# Patient Record
Sex: Male | Born: 1935 | Race: White | Hispanic: No | Marital: Married | Smoking: Former smoker
Health system: Southern US, Community
[De-identification: ages and names within clinical notes are randomized; demographics above are authoritative.]

## PROBLEM LIST (undated history)

## (undated) ENCOUNTER — Inpatient Hospital Stay (HOSPITAL_COMMUNITY): Payer: Medicare HMO | Admitting: Podiatry

## (undated) DIAGNOSIS — I10 Essential (primary) hypertension: Secondary | ICD-10-CM

## (undated) DIAGNOSIS — N189 Chronic kidney disease, unspecified: Secondary | ICD-10-CM

## (undated) DIAGNOSIS — I350 Nonrheumatic aortic (valve) stenosis: Secondary | ICD-10-CM

## (undated) DIAGNOSIS — J189 Pneumonia, unspecified organism: Secondary | ICD-10-CM

## (undated) DIAGNOSIS — Z87442 Personal history of urinary calculi: Secondary | ICD-10-CM

## (undated) DIAGNOSIS — E119 Type 2 diabetes mellitus without complications: Secondary | ICD-10-CM

## (undated) HISTORY — PX: HERNIA REPAIR: SHX51

## (undated) HISTORY — PX: APPENDECTOMY: SHX54

## (undated) HISTORY — PX: FINGER SURGERY: SHX640

## (undated) HISTORY — PX: EYE SURGERY: SHX253

## (undated) HISTORY — PX: CHOLECYSTECTOMY: SHX55

---

## 2007-01-07 ENCOUNTER — Inpatient Hospital Stay (HOSPITAL_COMMUNITY): Admission: RE | Admit: 2007-01-07 | Discharge: 2007-01-10 | Payer: Self-pay | Admitting: Specialist

## 2007-06-29 ENCOUNTER — Inpatient Hospital Stay (HOSPITAL_COMMUNITY): Admission: EM | Admit: 2007-06-29 | Discharge: 2007-07-02 | Payer: Self-pay | Admitting: Emergency Medicine

## 2007-07-01 ENCOUNTER — Encounter (INDEPENDENT_AMBULATORY_CARE_PROVIDER_SITE_OTHER): Payer: Self-pay | Admitting: Surgery

## 2007-07-02 ENCOUNTER — Ambulatory Visit: Payer: Self-pay | Admitting: Internal Medicine

## 2008-04-24 IMAGING — CR DG CHEST 2V
2 series · 2 of 2 positions shown · non-contrast
Comparison: none

CLINICAL DATA: 72-year-old with left knee osteoarthritis.  Preoperative evaluation.  History of smoking.
CHEST - 2 VIEW:

[w chest pa]
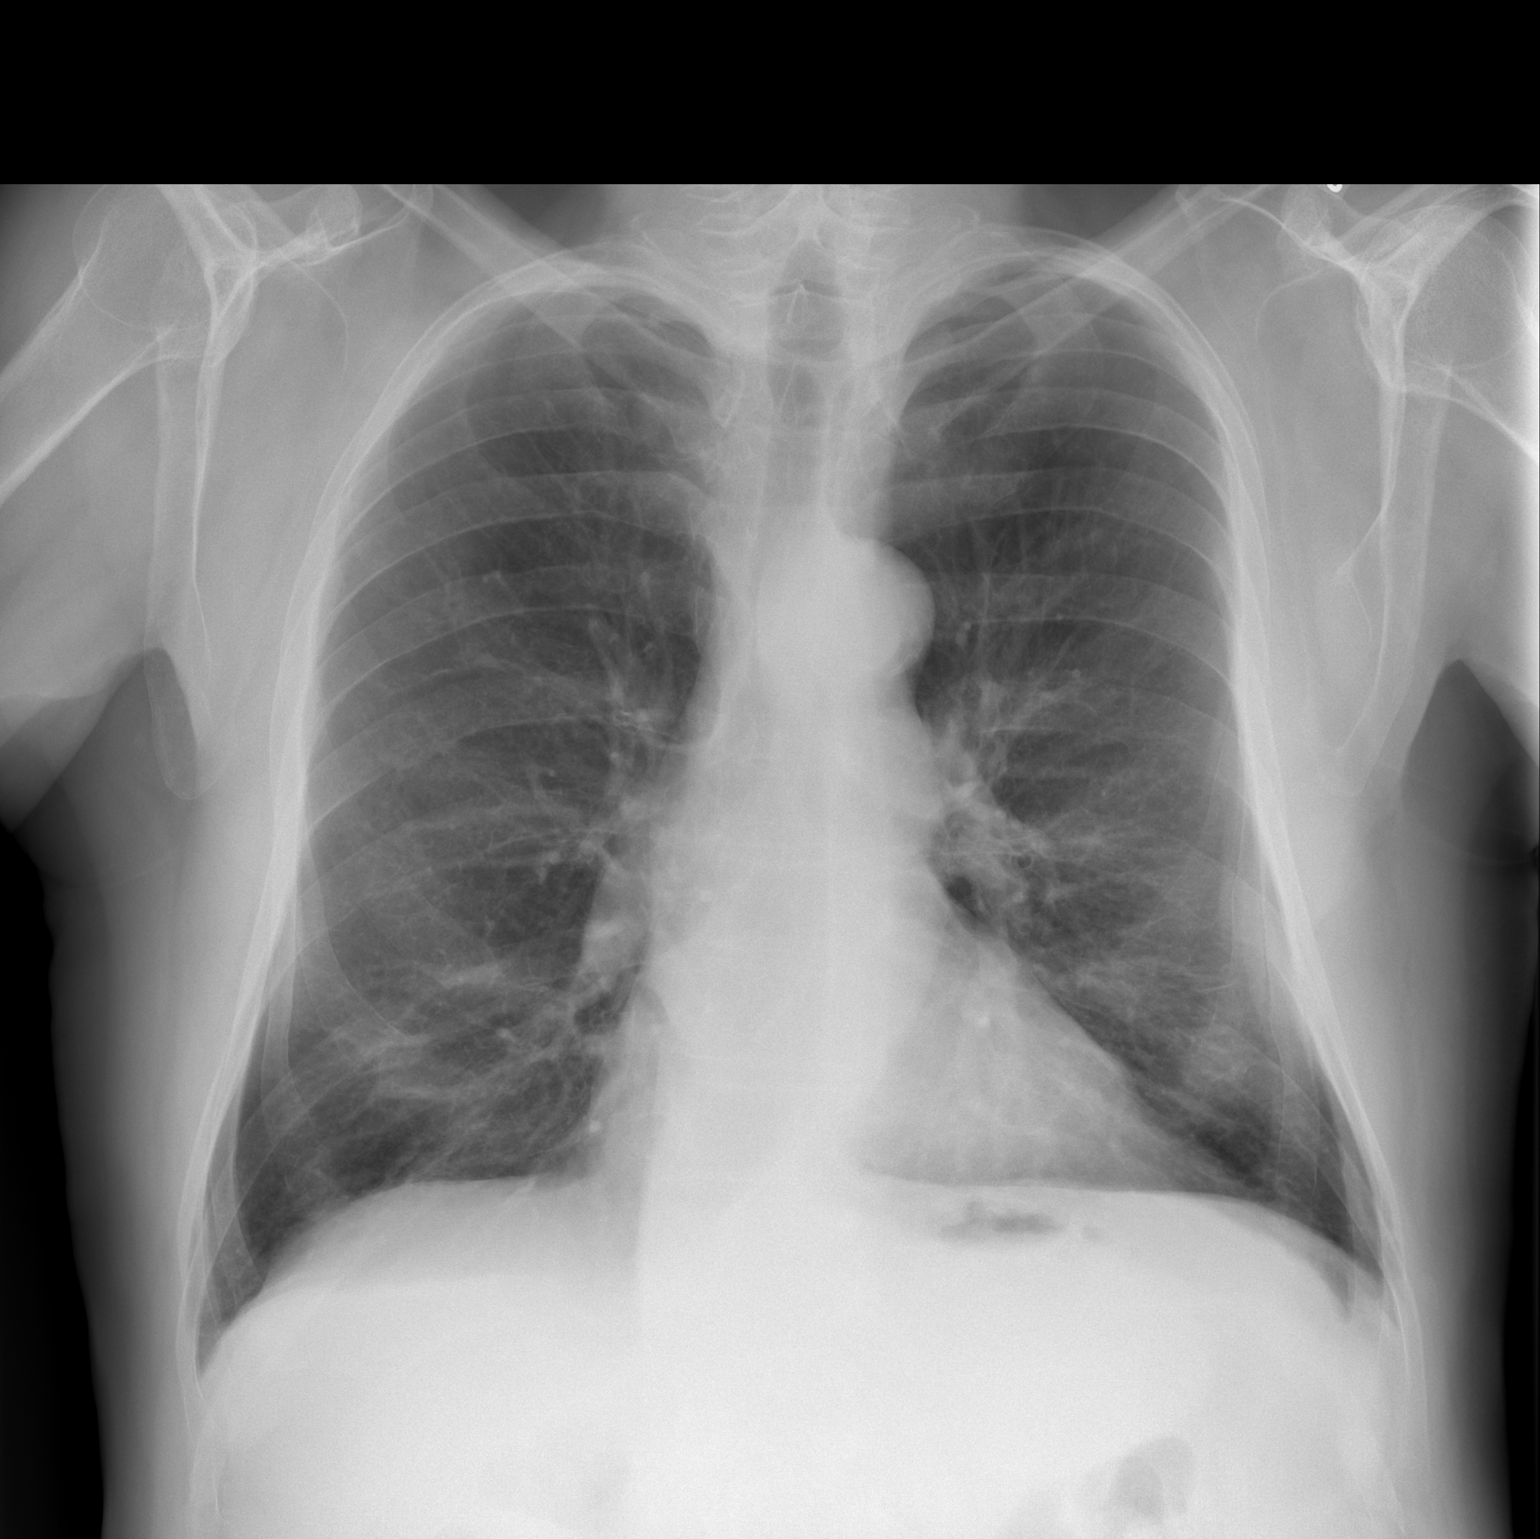

[w chest lat]
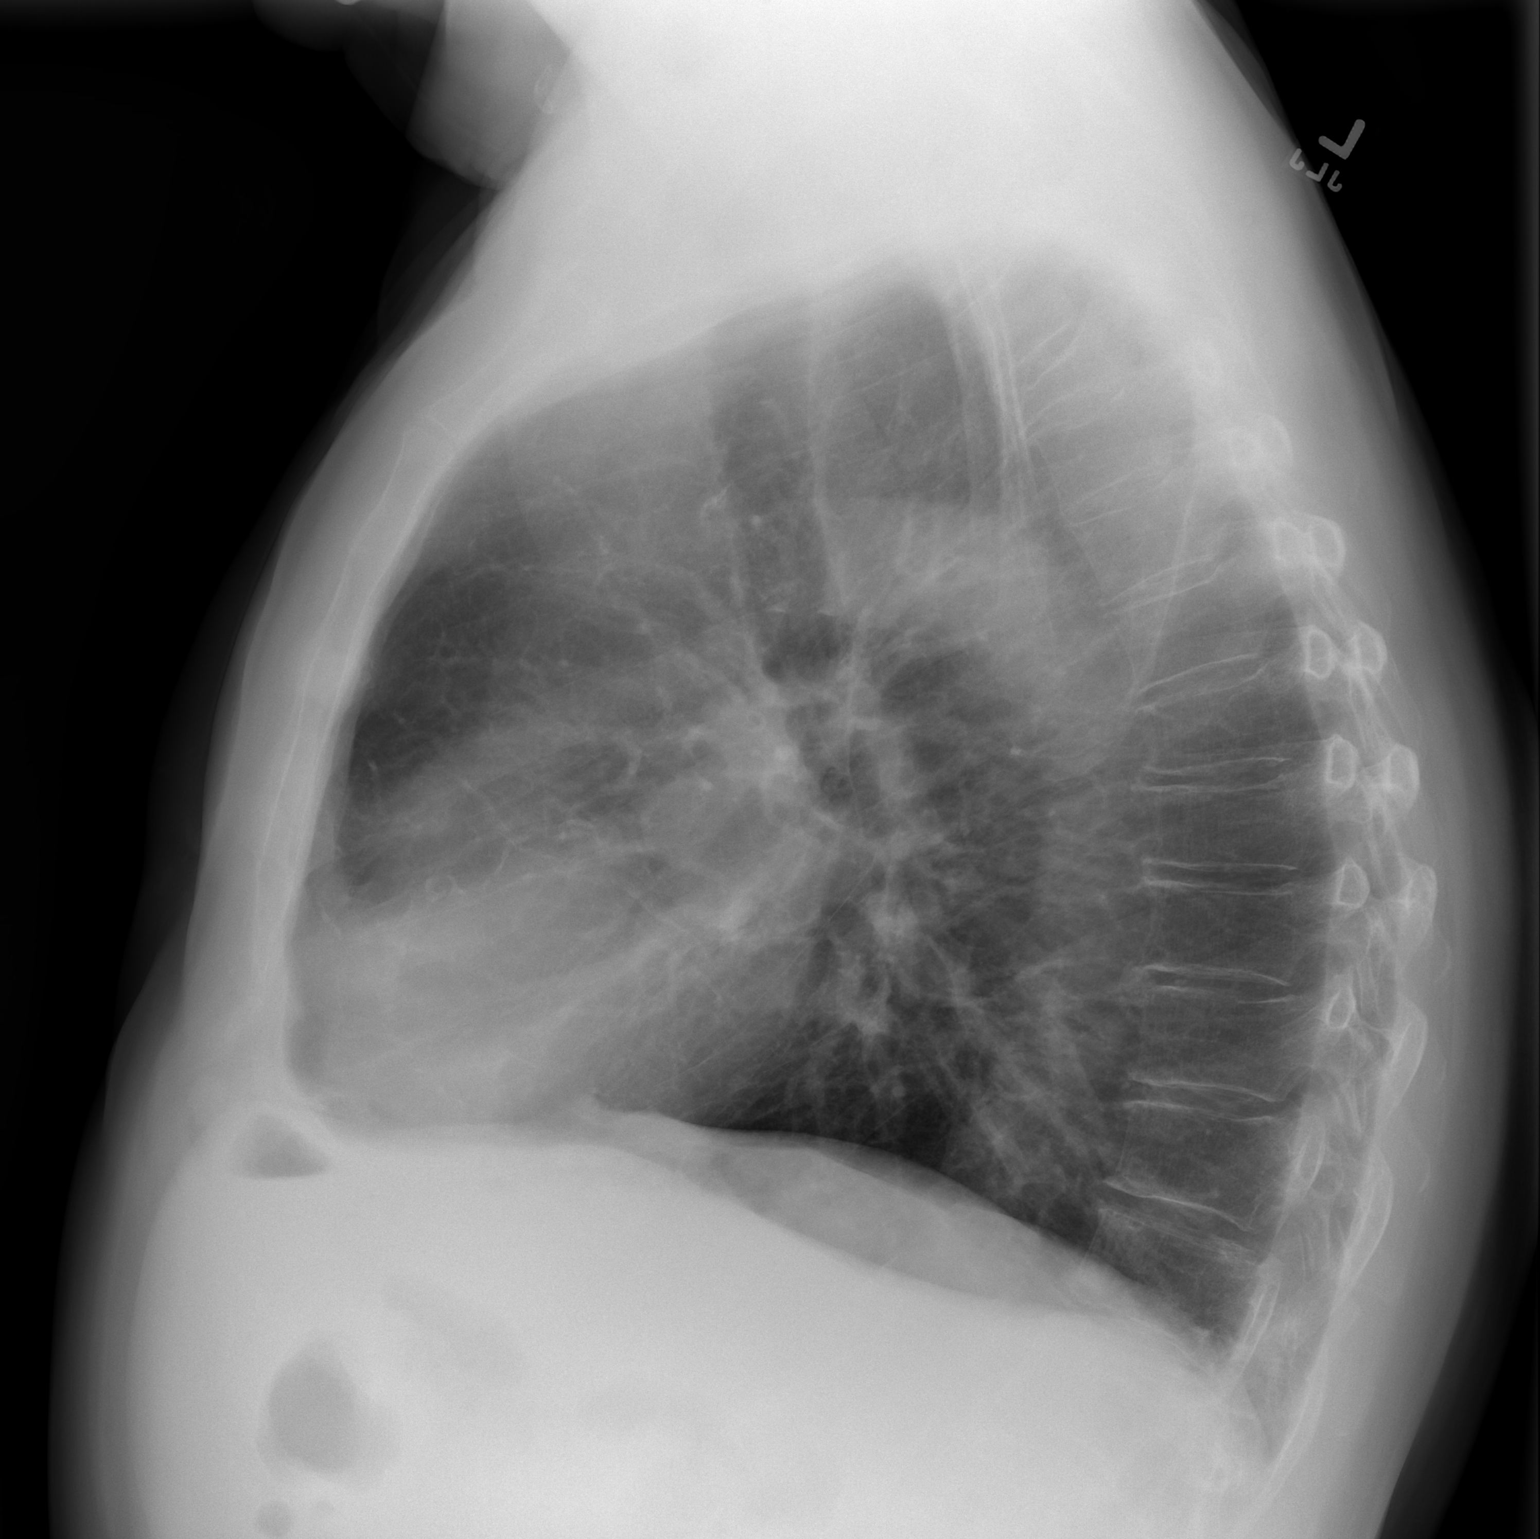

[2 of 2 positions shown; findings below may reference images not displayed]

FINDINGS: Two views of the chest demonstrate mild hyperinflation probably representing a component of emphysema.  Heart and mediastinum are within normal limits.  The trachea is midline.  Prominent vascular interstitial markings in the lower lung regions likely chronic in nature.  However, there is vague nodular density in the left lung base.  Unclear this is related to a nipple shadow.  It may also represent a conglomeration of overlying shadows and structures.  Otherwise, there is no focal air space disease.
IMPRESSION: 1.  Hyperinflation and probable emphysematous changes. 
2.  Indeterminate opacity in the left lung base.  Although this could represent nipple or even overlying shadows, recommend further evaluation with oblique chest images or CT to exclude a pulmonary nodule.

## 2008-10-20 IMAGING — US US ABDOMEN COMPLETE
1 series · 14 of 25 positions shown · non-contrast
Comparison: None

CLINICAL DATA: Abdominal pain

ABDOMEN ULTRASOUND
TECHNIQUE: Complete abdominal ultrasound examination was performed including
evaluation of the liver, gallbladder, bile ducts, pancreas, kidneys, spleen,
IVC, and abdominal aorta.

[Series 1: unknown · 0.43mm/px · 14 of 72 slices shown]
[im 1/72]
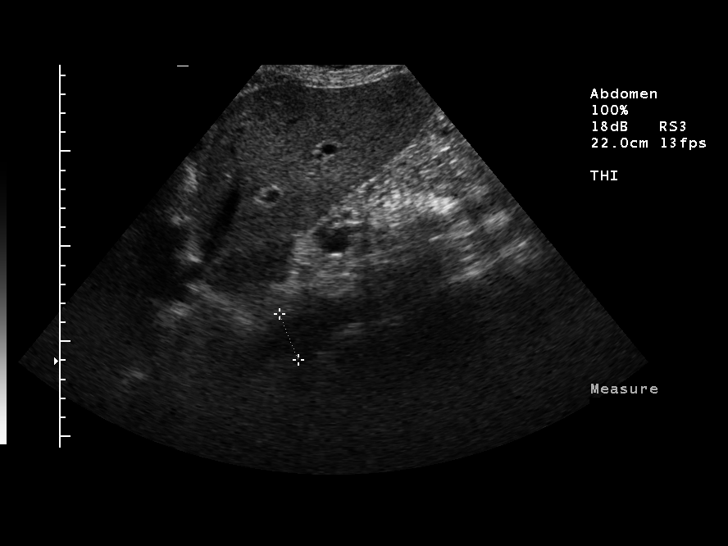
[im 6/72]
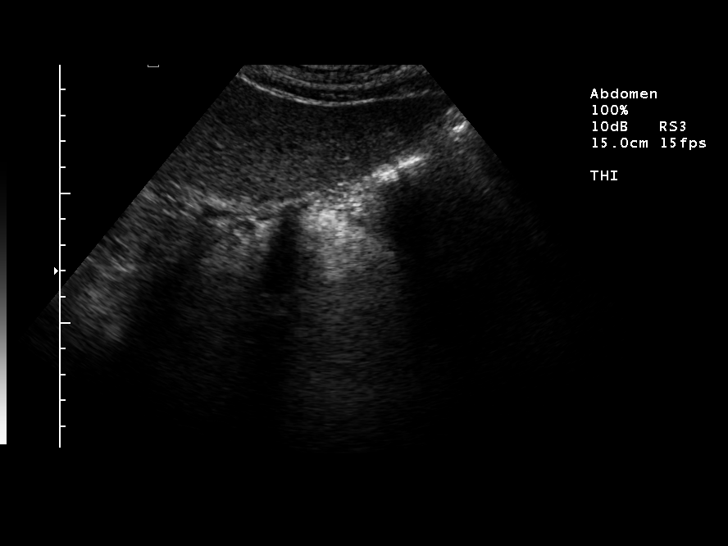
[im 12/72]
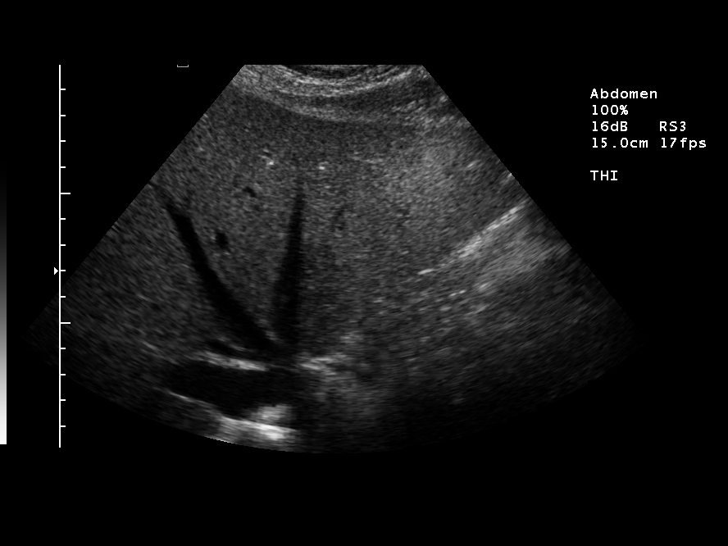
[im 18/72]
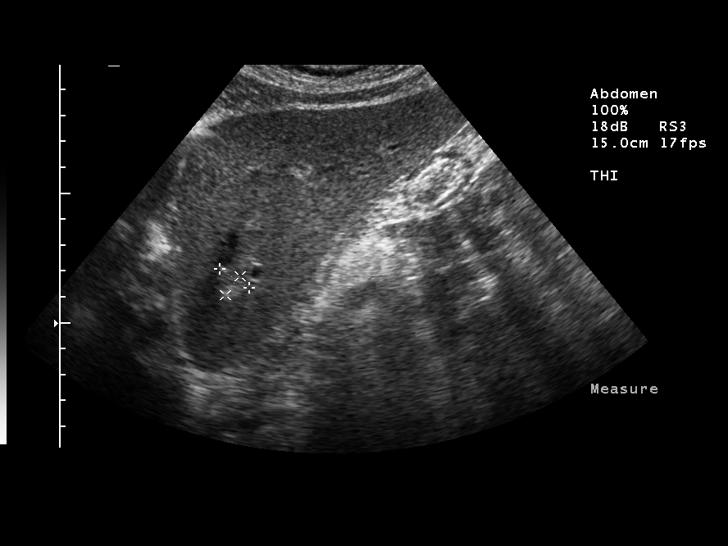
[im 24/72]
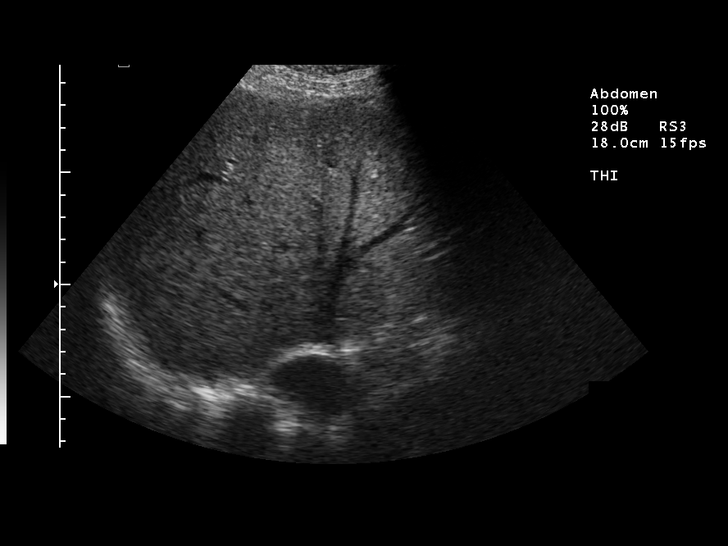
[im 27/72]
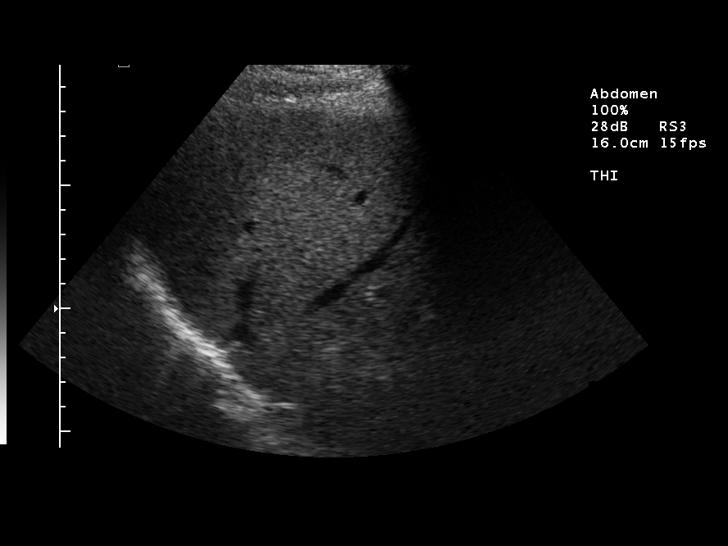
[im 33/72]
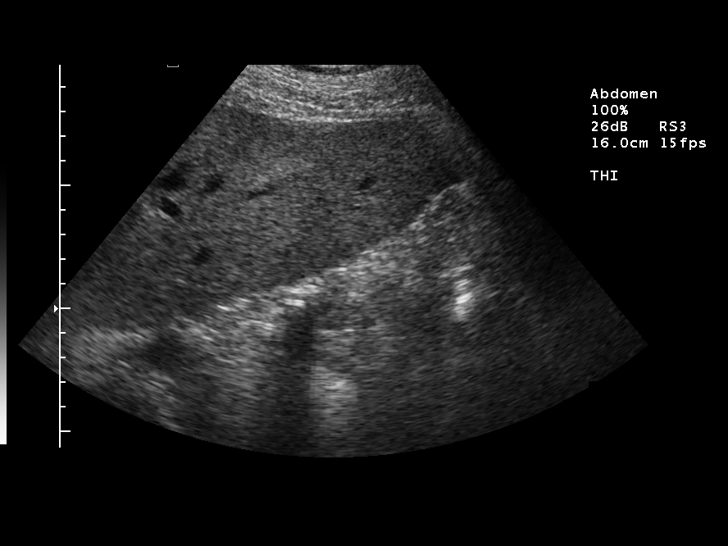
[im 39/72]
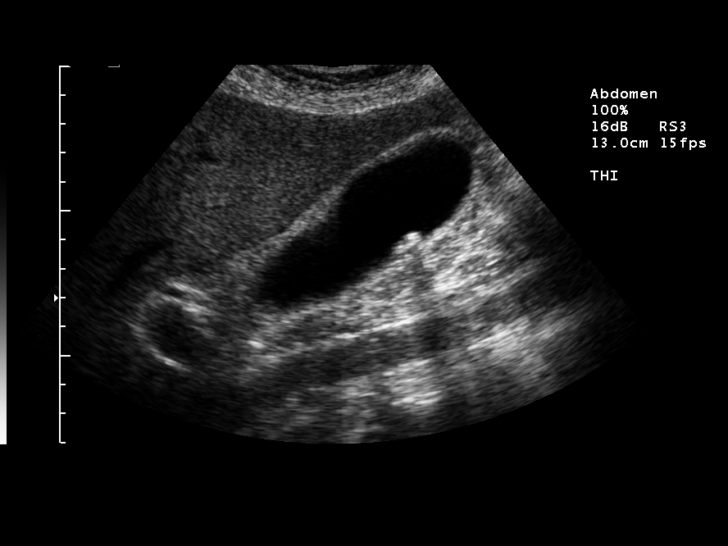
[im 45/72]
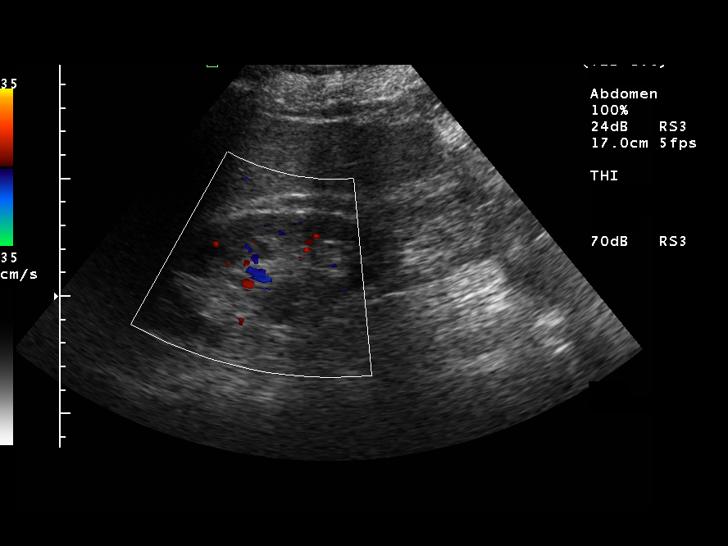
[im 48/72]
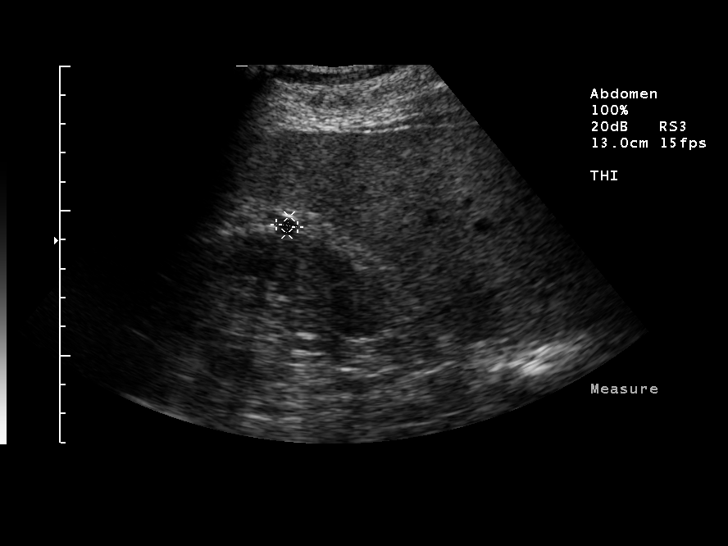
[im 54/72]
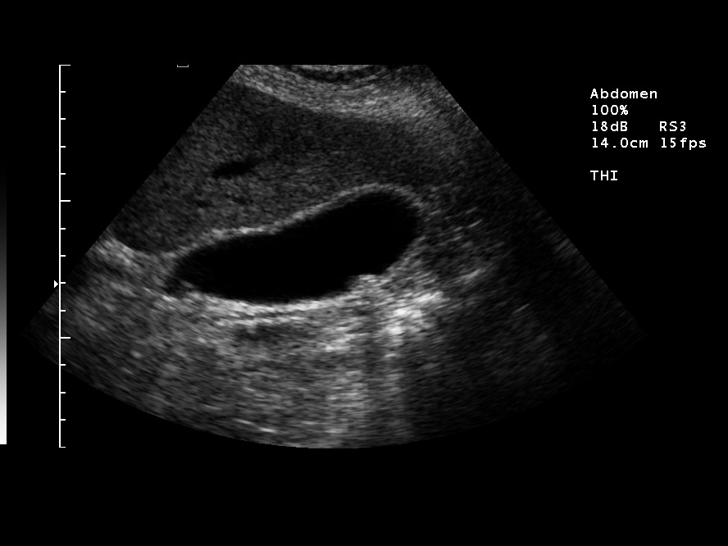
[im 60/72]
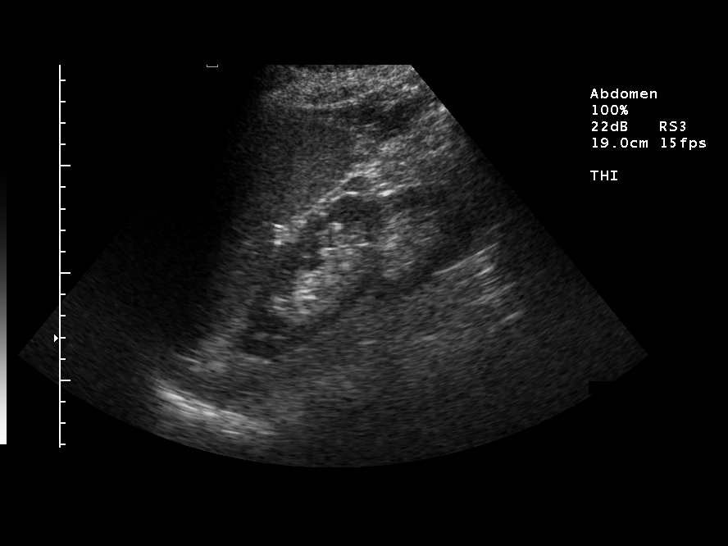
[im 66/72]
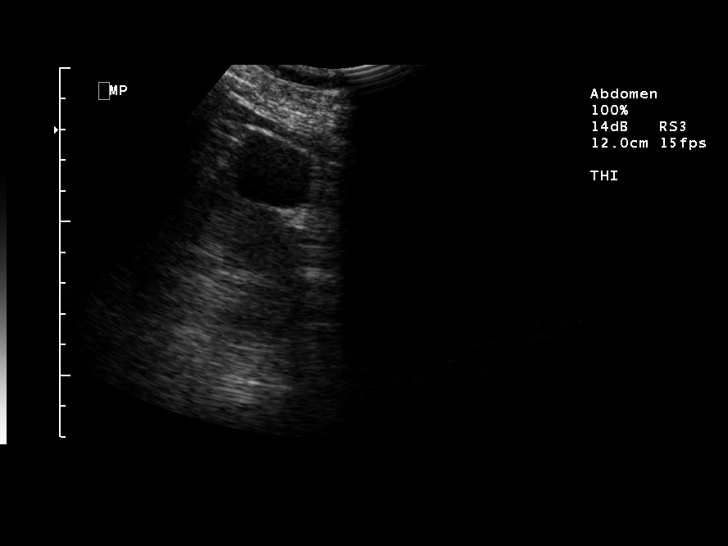
[im 72/72]
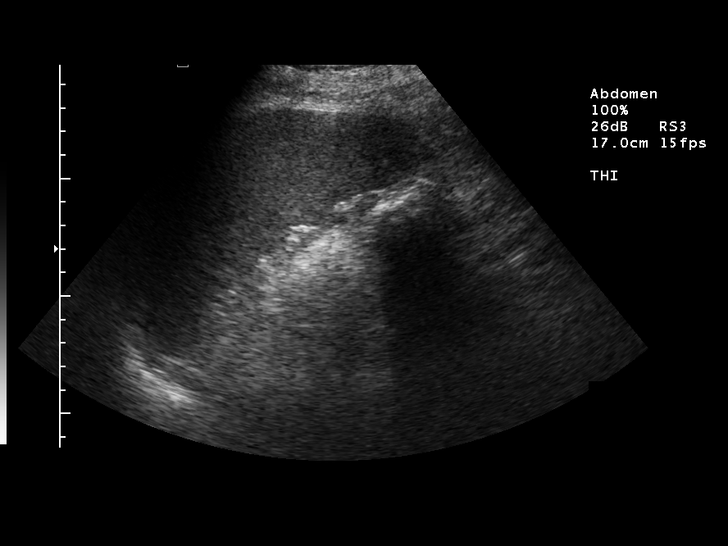

[14 of 25 positions shown; findings below may reference images not displayed]

FINDINGS: Maximum aortic diameter 2.6 cm. Visualized pancreas are unremarkable.
Within the liver, there is a 1.4 cm echogenic area, likely small hemangioma.
There are a few layering small gallstones present. Mild gallbladder wall
thickening present. No pericholecystic fluid. Common bile duct normal at 5 mm.

IVC is unremarkable. Spleen mildly enlarged at 14.6 cm. No focal lesions in the
spleen. Small cysts noted in the kidneys bilaterally.

IMPRESSION

Small gallstones. Gallbladder wall is mildly thickened. No pericholecystic fluid
or definite sonographic Murphy's sign. 

Small renal cysts.

Small hemangioma within the liver.

Mild splenomegaly.

## 2011-02-20 NOTE — H&P (Signed)
Harold Rogers, Harold Rogers               ACCOUNT NO.:  [id number]   MEDICAL RECORD NO.:  [id number]          PATIENT TYPE:  INP   LOCATION:  1527                         FACILITY:  [facility]   PHYSICIAN:  Velora Heckler, MD      DATE OF BIRTH:  Mar 18, 1935   DATE OF ADMISSION:  06/29/2007  DATE OF DISCHARGE:                              HISTORY & PHYSICAL   REASON FOR ADMISSION:  Acute cholecystitis, probable  choledocholithiasis, rule out cholangitis.   HISTORY OF PRESENT ILLNESS:  The patient is a 76 year old, white male  from [place], [place].  He is accompanied by his wife.  He  presents to the emergency department with a 12-hour history of  epigastric abdominal pain, cramps, nausea and vomiting.  The patient  became febrile to 102.6 during his ER visit.  Laboratory studies showed  an elevated total bilirubin of 3.8 with elevated transaminases and  alkaline phosphatase.  Abdominal ultrasound performed by the radiology  department showed mild gallbladder wall thickening.  There were small  gallstones.  There was no pericholecystic fluid.  There was no  sonographic Murphy sign.  Biliary dilatation was not noted in the  radiology report.  General surgery was called for management.   The patient denies any prior history of jaundice or acholic stools.  He  has had only a previous appendectomy for abdominal surgery.  He now  presents through the emergency department for admission and management.   PAST MEDICAL HISTORY:  1. History of hypercholesterolemia.  2. History of gastroesophageal reflux disease.  3. Status post left total knee replacement by Dr. Valma Cava.  4. Status post appendectomy at [facility] in 2005.  5. Status post hemorrhoidectomy.   MEDICATIONS:  Omeprazole and Lipitor.   ALLERGIES:  No known drug allergies.   SOCIAL HISTORY:  The patient is married.  He is accompanied by his wife.  He has two children.  He is relocated to [facility] from  [place].  He is retired.  He quit smoking 10 years ago.  He does  drink alcohol daily.   REVIEW OF SYSTEMS:  A 15 system review documented in our medical record  without significant other findings.   PHYSICAL EXAMINATION:  GENERAL:  A 76 year old, bright, alert, white  male in mild discomfort on a stretcher in the emergency department.  VITAL SIGNS:  Temperature 102.6 rectal, 101.7 oral, blood pressure  138/73, pulse 98, respirations 20, O2 saturation 97%.  HEENT:  Normocephalic, atraumatic.  Sclerae clear.  Conjunctiva clear.  Dentition fair.  Mucous membranes moist.  Voice normal.  NECK:  Palpation of the neck shows no mass.  No thyroid nodularity.  No  lymphadenopathy.  No tenderness.  LUNGS:  Clear to auscultation bilaterally without rales, rhonchi or  wheeze.  CARDIAC:  Exam shows regular rate and rhythm without murmur.  Peripheral  pulses are full.  EXTREMITIES:  Nontender without edema.  ABDOMEN:  Soft without distention.  Bowel sounds are present.  Well-  healed surgical wounds at the umbilicus.  Palpation, however, reveals  mild to moderate tenderness in the right upper quadrant  and epigastrium  with voluntary guarding.  There is no palpable mass.  There is no sign  of hernia.  EXTREMITIES:  No edema.  NEUROLOGIC:  The patient is alert and oriented.  There is no sign of  tremor.   LABORATORY DATA AND X-RAY FINDINGS:  White count 7.6, hemoglobin 14.2,  hematocrit 40.8%, platelet count 141,000.  Differential shows 97%  neutrophils, 1% lymphocytes, 2% monocytes.  Electrolytes were normal.  Glucose is elevated at 285.  Liver function tests are notable for a  total bilirubin of 3.8, alkaline phosphatase elevated at 218 and  elevated transaminases of SGOT 469 and SGPT 454.   Radiographic studies with abdominal ultrasound demonstrating mild  gallbladder wall thickening, small gallstones, no pericholecystic fluid.  Biliary dilatation is not noted on the report.  Chest x-ray  is pending.  EKG pending.   IMPRESSION:  1. Acute cholecystitis.  2. Probable choledocholithiasis.  3. Rule out cholangitis.  4. Hypercholesterolemia.  5. Gastroesophageal reflux.  6. Probable undiagnosed diabetes mellitus.   PLAN:  The patient be admitted on the general surgical service at [facility].  He will require consultation from  gastroenterology for possible need for ERCP with sphincterotomy and  stone extraction.  Laboratory results will be monitored.  Preoperative  workup will be completed.  The patient will require cholecystectomy  during this hospitalization.  CBGs will be drawn every 6 hours and  treated with sliding scale insulin as needed.   Laparoscopic cholecystectomy was discussed with the patient and his  wife.  They understand the need for the procedure.  We will review it in  more detail prior to surgery.      Velora Heckler, MD  Electronically Signed     TMG/MEDQ  D:  06/29/2007  T:  06/30/2007  Job:  [id number]   cc:   Nelva Nay, MD  Fax: [phone]

## 2011-02-20 NOTE — Op Note (Signed)
NAMEKEN, BONN               ACCOUNT NO.:  [id number]   MEDICAL RECORD NO.:  [id number]          PATIENT TYPE:  INP   LOCATION:  1527                         FACILITY:  [facility]   PHYSICIAN:  Sandria Bales. Ezzard Standing, M.D.  DATE OF BIRTH:  09/06/1935   DATE OF PROCEDURE:  07/01/2007  DATE OF DISCHARGE:                               OPERATIVE REPORT   PREOPERATIVE DIAGNOSIS:  Cholecystitis with history of common bile duct  stone.   POSTOPERATIVE DIAGNOSIS:  Chronic cholecystitis, cholelithiasis.   PROCEDURE:  Laparoscopic cholecystectomy with intraoperative  cholangiogram.   SURGEON:  Sandria Bales. Ezzard Standing, M.D.   FIRST ASSISTANT:  Currie Paris, M.D.   ANESTHESIA:  General endotracheal.   ESTIMATED BLOOD LOSS:  Minimal.   INDICATIONS FOR PROCEDURE:  The patient is a 76 year old white male who  was admitted to [facility] on 06/29/2007, seen by Dr. Darnell Level.  He was noted to have elevated liver functions and underwent an  ERCP by Dr. Stan Head yesterday who actually found no obvious common  bile duct stones.  He went on and did a sphincterotomy.  Today, his  bilirubin remains up at 6.9 with a mildly elevated amylase of 327.  The  patient is clinically asymptomatic.  I think we will go on ahead with  his gallbladder surgery.   I spoke to the patient about the pros and cons of surgery, potential  risks of surgery include, but are not limited to, bleeding, infection,  bile duct injury, and the possibility of open surgery.   DESCRIPTION OF PROCEDURE:  The patient was taken to the operating room.  The patient had his abdomen prepped with Betadine solution and sterilely  draped.  He was already on Unasyn antibiotic.  He had PF stockings in  place.  A time out was held, identifying the patient and the procedure.   An infraumbilical incision was made with sharp dissection and carried  down to the abdominal cavity.  A 0-degree 10-mm laparoscope was inserted  through a  12-mm Hasson trocar at the infraumbilical incision.   Three additional trocars, a 10-mm subxiphoid trocar and a 5-mm right  intercostal and 5-mm lateral trocar were placed.   The gallbladder was identified and was noted to be somewhat edematous,  rotated cephalad and dissection carried out, identifying the cystic  duct, gallbladder junction.  He had a cystic artery identified, doubly  endoclipped and divided.  I then placed a clip on the gallbladder side  of the cystic duct and shot intraoperative cholangiogram.   The intraoperative cholangiogram was shot using half-strength Hypaque  solution through a cut taut catheter inserted through a 14-gauge Jelco.  The taut catheter was secured with Endoclips.   The cholangiogram showed free flow of contrast down the cystic duct into  the common bile duct into the duodenum.  There was no filling defect, no  mass, no impingement on the common bile duct.  It was felt to be a  normal intraoperative cholangiogram.  This was done with fluoroscopy.   The taut catheter was then removed.  The cystic duct was  triply  endoclipped and divided.  The gallbladder was then sharply and bluntly  dissected from the gallbladder bed.  Prior to complete division of the  gallbladder from the gallbladder bed, I revisualized the triangle of  Calot.  I revisualized the gallbladder bed and saw no bleeding, no bile  leak.  I divided the gallbladder from the gallbladder bed, placed it in  an Endocath bag and delivered through the umbilicus.   I irrigated the abdomen with about 2 L of saline.  I used Bovie  electrocautery on the gallbladder bed.  There was no bleeding or bile  leak at the end of the procedure.  The umbilical port was closed with 0  Vicryl suture.  The trocars were removed under direct visualization.  The skin was closed with 5-0 Vicryl, painted with tincture of benzoin,  and steri-stripped.   The patient tolerated the procedure well and was transferred  to the  recovery room in good condition.      Sandria Bales. Ezzard Standing, M.D.  Electronically Signed     DHN/MEDQ  D:  07/01/2007  T:  07/02/2007  Job:  [id number]   cc:   Iva Boop, MD,FACG  [facility]  [address] [place], [place] [address]

## 2011-02-23 NOTE — Discharge Summary (Signed)
Harold Rogers, Harold Rogers               ACCOUNT NO.:  [id number]   MEDICAL RECORD NO.:  [id number]          PATIENT TYPE:  INP   LOCATION:  1514                         FACILITY:  [facility]   PHYSICIAN:  Erasmo Leventhal, M.D.DATE OF BIRTH:  02-Mar-1935   DATE OF ADMISSION:  01/07/2007  DATE OF DISCHARGE:  01/10/2007                               DISCHARGE SUMMARY   ADMITTING DIAGNOSIS:  End-stage osteoarthritis, left knee.   DISCHARGE DIAGNOSIS:  End-stage osteoarthritis, left knee.   OPERATION:  Total knee arthroplasty, left knee.   BRIEF HISTORY:  This is a 76 year old gentleman with a history of end-  stage osteoarthritis of his left knee which failed conservative  treatment. After discussion of the treatment benefits, risks, and  options, the patient is scheduled for total knee arthroplasty.   LABORATORY VALUES:  Admission CBC within normal limits.  Admission CMET  within normal limits with the exception of slightly high glucose at 142.  Admission PT and PTT within normal limits with the exception of mildly  elevated PTT at 42 and urinalysis normal.  Hemoglobin and hematocrit  reached a low of 10 and 28.1 on the fourth and BMET showed mildly  elevated glucose throughout admission.   HOSPITAL COURSE:  The patient tolerated the operative procedure well.  The first postoperative day his vital signs were stable, he was  afebrile.  His lungs were clear, heart sounds normal and bowel sounds  were sluggish. His drain was removed without difficulty.  Neurovascular  status was intact in his leg and physical therapy and CPM were  initiated. The second postoperative day vital signs were stable,  temperature to a max of 100.9, O2 sats were good.  Hemoglobin 96,  hematocrit 26.9, platelets were slightly low at 136. Glucose remained  mildly elevated at 154.  Lungs clear.  Heart sounds normal.  Bowel  sounds active.  Calves negative.  His dressing was changed, wound  benign.  The third  postoperative day he was feeling much better.  Vital  signs stable, afebrile, O2 sats 96, hemoglobin 10, hematocrit 28.1,  platelets up to 144, BMET within normal limits with the exception of  slightly elevated glucose.  Lungs clear.  Heart sounds normal.  Bowel  sounds active.  Calves negative.  Wound benign. The patient subsequently  wanted to be discharged home and that is reasonable. He will get Lovenox  teaching today, physical therapy this morning and then we will discharge  him.   CONDITION ON DISCHARGE:  Improved.   DISCHARGE MEDICATIONS:  1. Percocet 1-2 q.4-6 h p.r.n. pain.  2. Robaxin 500 one p.o. q.8 h p.r.n. spasm.  3. Lovenox 1 shot q.12 h at 7:00 a.m. and 7:00 p.m.  4. Trinsicon 1 b.i.d.   DISCHARGE INSTRUCTIONS:  He will do his therapy, his home CPM and return  to the office in 2 weeks for recheck or sooner p.r.n. problems.      Jaquelyn Bitter. Chabon, P.A.    ______________________________  Erasmo Leventhal, M.D.    SJC/MEDQ  D:  01/10/2007  T:  01/10/2007  Job:  [id number]

## 2011-02-23 NOTE — H&P (Signed)
Harold Rogers, SHERROW NO.:  [id number]   MEDICAL RECORD NO.:  [id number]        PATIENT TYPE:  LINP   LOCATION:                               FACILITY:  [facility]   PHYSICIAN:  Erasmo Leventhal, M.D.DATE OF BIRTH:  Feb 23, 1935   DATE OF ADMISSION:  01/07/2007  DATE OF DISCHARGE:                              HISTORY & PHYSICAL   CHIEF COMPLAINT:  Osteoarthritis, left knee.   HISTORY OF PRESENT ILLNESS:  This is a 76 year old gentleman with a  history of end-stage osteoarthritis of his left knee which has failed  conservative treatment.  After discussion of treatment options, risks  and benefits, the patient is scheduled for total knee arthroplasty of  left knee.  The surgery, risks, benefits and aftercare were discussed in  detail with the patient questions invited and surgery to go ahead as  scheduled.  His medical doctor put him on a beta-blocker preoperative  for cardiac protection, but stated that he did need to stay on it  postoperative after we discussed this with him at his office today.  Otherwise, he has stopped his aspirin and stopped his nabumetone.   ALLERGIES:  No known drug allergies.   CURRENT MEDICATIONS:  1. Lipitor 40 mg one daily.  2. Omeprazole 20 mg one q. p.o. daily.  3. Darvocet one q.8 h. p.r.n. pain.  4. Tramadol 50 mg two q.8 h. p.r.n. pain.  5. Lasix 40 mg one p.o. every other day.  6. Aspirin 81 mg one daily.  7. Nabumetone 750 mg two daily.   PAST SURGICAL HISTORY:  1. Appendectomy.  2. Hammertoe.  3. Arthroscopy of the knee.  4. LASIK eye surgery.  5. Finger surgery for amputations.   PAST MEDICAL HISTORY:  1. Hyperlipidemia.  2. Gastroesophageal reflux disease.  3. Osteoarthritis.   FAMILY HISTORY:  Positive coronary disease, diabetes and cancer.   SOCIAL HISTORY:  The patient is married.  He is retired.  He does not  smoke.  He quit 10 years ago.  He drinks two to four beers per day.   REVIEW OF SYSTEMS:  CENTRAL  NERVOUS SYSTEM:  Negative for headache,  blurry vision or dizziness.  PULMONARY:  Negative for shortness breath,  PND or orthopnea.  CARDIOVASCULAR:  Positive for hyperlipidemia.  Negative for chest pain, palpitation.  GI:  Positive for reflux,  otherwise negative for ulcers or hepatitis.  GU: Positive for history of  a kidney stone.  MUSCULOSKELETAL:  As positive in HPI.   PHYSICAL EXAMINATION:  VITAL SIGNS:  BP 124/68, respirations 18, pulse  68 and regular.  GENERAL:  A well-developed, well-nourished gentleman in no acute  distress.  HEENT:  Head normocephalic.  Nose patent.  Ears patent.  Pupils equal,  round, reactive to light.  Throat without injection.  NECK:  Supple without adenopathy.  Carotids 2+ without bruit.  CHEST:  Clear to auscultation no rales or rhonchi.  Respirations 18.  HEART:  Regular rate and rhythm with 68 beats per without murmur.  ABDOMEN:  Soft, active bowel sounds.  No mass or organomegaly.  NEUROLOGIC:  The patient alert  and oriented to time, place and person.  Cranial nerves 2-12 grossly intact.  EXTREMITIES:  A 5-degree flexion contracture of the knee with further  flexion to 120.  Dorsalis pedis and posterior tibialis pulses are 2+.  There is a slight varus deformity.   LABORATORY DATA AND X-RAY FINDINGS:  X-rays show end-stage  osteoarthritis of left knee.   IMPRESSION:  End-stage osteoarthritis of left knee.   PLAN:  Total knee arthroplasty left knee.      Jaquelyn Bitter. Chabon, P.A.    ______________________________  Erasmo Leventhal, M.D.    SJC/MEDQ  D:  12/30/2006  T:  12/30/2006  Job:  [id number]

## 2011-02-23 NOTE — Op Note (Signed)
NAMERYOT, BURROUS NO.:  [id number]   MEDICAL RECORD NO.:  [id number]          PATIENT TYPE:  INP   LOCATION:  0009                         FACILITY:  [facility]   PHYSICIAN:  Erasmo Leventhal, M.D.DATE OF BIRTH:  26-Nov-1934   DATE OF PROCEDURE:  01/07/2007  DATE OF DISCHARGE:                               OPERATIVE REPORT   PREOPERATIVE DIAGNOSIS:  Left knee end-stage osteoarthritis.   POSTOPERATIVE DIAGNOSIS:  Left knee end-stage osteoarthritis.   PROCEDURE:  Left total knee arthroplasty.   SURGEON:  Erasmo Leventhal, M.D.   ASSISTANT:  Jaquelyn Bitter. Chabon, PA-C.   ANESTHESIA:  Spinal.   ESTIMATED BLOOD LOSS:  Less than 100 cc.   DRAINS:  Two medium Hemovacs.   COMPLICATIONS:  None.   TOURNIQUET TIME:  An hour and 30 minutes at 300 mmHg.   COMPLICATIONS:  None.   DISPOSITION:  PACU stable.   OPERATIVE DETAILS:  Patient was counseled in the holding area.  The  correct side was identified.  An IV was started.  Antibiotics were  given.  Patient was taken to the operating room, placed in the lateral  position, where a spinal stick was administered.  Following this, the  Foley catheter was placed utilizing sterile technique by the OR  circulating nurse.  All extremities were well padded and bumped.  The  left knee was examined.  A 10 degree flexion contracture flexed at 120  degrees.  Elevated with DuraPrep, draped in a sterile fashion.  Exsanguinated with esmarch.  The tourniquet was inflated to 300 mmHg.  A  straight and midline incision was made in the skin and subcutaneous  tissue.  Mediolateral skin flaps were developed at the appropriate  level.  Medial parapatellar arthrotomy was performed.  Proximal medial  soft tissue release was done.  The knee was flexed.  The patella was  everted out of the way.  Cruciate ligaments were resected.  A starter  hole was made in the distal femur.  The canal was irrigated, effluent  was clear.  An  extramedullary rod was gently placed.  We chose a 5  degree valgus cut and took an 11 mm cut off the distal femur due to his  flexion contracture.  The distal femur was found to a size.  Rotation  marks were made.  The distal femur was cut to fit a size #5.  Medial  lateral menisci were removed under direct visualization.  Geniculate  vessels were coagulated.  Posterior neurovascular structures were thawed  off and protected throughout the entire case.  Medial osteophytes were  removed.  Tibial eminences were resected.  The proximal tibia was found  to be a size 5.  A central hole was made.  A step reamer was utilized.  The intramedullary aspect was irrigated.  The effluent was clear.  The  extramedullary rod was gently placed.  We chose a 0 degree slope with a  10 mm cut off the proximal tibia, based upon the lateral side, which was  the least deficient.  Following this, flexion and extension blocks were  tightened in  flexion and extension.  Therefore, another 2 mm was taken  off the proximal tibia.  Posterior medial and posterior femoral  osteophytes were removed under direct visualization.  A tibial base  plate was applied.  Rotation coverage was set.  Reamer and punch were  performed.  A femoral box cut was then performed, and the femoral trial  was then implanted.  With the 10 insert with good range of motion,  alignment, and balance.  The patella was found to be a size 35.  The  appropriate amount of bone was resected.  A locking hole was made, and  the patella button was applied.  We had anatomic patellofemoral  tracking.  At this time, all trials were removed.  The knee was  irrigated with pulsatile lavage.  Utilizing modern cement technique, all  components were cemented into place.  A size 5 femur, size 5 tibia, 35  patella.  After the cement had cured, all excess cement was removed.  Bone wax placed at the exposed bony surfaces.  We did 10 and 12.5 trial  inserts.  With the 12.5  trial insert, we had full extension, excellent  flexion, varus and valgus stress, 0-90 degrees.  The trial was removed.  The knee was again irrigated.  The final 12.5 posterior stabilized  rotating platform tibial insert was implanted.  The knee was again  checked.  Anatomic patellofemoral tracking, alignment, and stability.   Two medium Hemovac drains were placed.  A sequential closure of layers  was done.  Arthrotomy, Vicryl, subcu Vicryl, the skin closed with subcu  Monocryl suture.  Steri-Strips were applied.  Xeroform around the  drains.  Sterile dressing applied to the knee.  The tourniquet was  deflated.  Normal circulation of the foot and ankle at the end of the  case.  Ace wraps, ice pack, and knee immobilizer applied in full  extension.  Antibiotics had been given preoperatively prior to  tourniquet inflation.  Sponge and needle counts were correct.  No  complications or problems.   To help with surgical retraction and decision-making, Mr. Leilani Able,  PA-C's, assistance, was needed throughout the entire case.           ______________________________  Erasmo Leventhal, M.D.     RAC/MEDQ  D:  01/07/2007  T:  01/07/2007  Job:  [id number]

## 2011-02-23 NOTE — Discharge Summary (Signed)
Harold Rogers, Harold Rogers               ACCOUNT NO.:  [id number]   MEDICAL RECORD NO.:  [id number]          PATIENT TYPE:  INP   LOCATION:  1527                         FACILITY:  [facility]   PHYSICIAN:  Sandria Bales. Ezzard Standing, M.D.  DATE OF BIRTH:  [birth year]   DATE OF ADMISSION:  06/29/2007  DATE OF DISCHARGE:  07/02/2007                               DISCHARGE SUMMARY   DISCHARGE DIAGNOSES:  1. Chronic cholecystitis with cholelithiasis.  2. Elevated liver functions with a normal ERCP.  3. Elevated blood glucoses with a mildly elevated hemoglobin A1c of      6.7.  No prior history of diabetes.  4. Elevated liver functions, presumably secondary to his gallbladder      disease.  5. Hypercholesterolemia.  6. Gastroesophageal reflux disease.   OPERATIONS PERFORMED:  The patient had an ERCP/sphincterotomy by Dr.  Stan Head on June 30, 2007.  He had a laparoscopic cholecystectomy with intraoperative cholangiogram  by Dr. Ovidio Kin, on July 01, 2007.   HISTORY OF ILLNESS:  Mr. Harold Rogers is a 76 year old white male who  is from [place], who has no real primary care doctor that he sees on a  regular basis.  He presented to the [facility] emergency room, was  admitted by Dr. Darnell Level on June 29, 2007.  His history is that  he had a 12-hour history of epigastric abdominal pain, cramps, nausea  and vomiting.  He was noted to be febrile in the emergency room at  102.6.   LABORATORY:  Showed an elevated bilirubin of 3.8 and elevated trans-  amylase and alkaline phosphatase.  An abdominal ultrasound revealed mild  gallbladder wall thickening with small gallstones.   He was admitted by Dr. Gerrit Friends with a diagnosis of possible  choledocholithiasis and cholecystitis.   By Dr. Stan Head saw him from a [place] [facility] standpoint.  He was taken  and had an ERCP on June 30, 2007.  Dr. Marvell Fuller comments were that  he had a normal common bile duct and hepatic ducts with no  obvious  stones.  He still did a sphincterotomy, to make sure that no stone had  just been passed, and due to his elevated liver functions.   Dr. Gerrit Friends did not have operative time and asked if I would see the  patient for consideration of surgery, and I met him on September 23 and  discussed with Harold Rogers proceeding about with laparoscopic  cholecystectomy.   He was taken to the operating room, where I did a laparoscopic  cholecystectomy and intraoperative cholangiogram.   For the first post-op day, he was afebrile to a temperature 97.8.  His  white blood count was 4,500.  His total bilirubin still elevated at 4.9  but was better.  His alkaline phos was 173, his albumin 2.7.  Hemoglobin  A1c was 6.7.   I discussed with him about letting him be discharged.   DISCHARGE INSTRUCTIONS:  Include:  1. A low fat diet.  2. He can remove his bandages the day after discharge and shower.  He      was given  Vicodin for pain.  He is to see me back in 2 to 3 weeks.      We are going to draw a blood on him on 2 days prior to him      returning, to make sure his liver functions were returning.  All      have returned to normal, and he is going to make an appointment      with a primary care doctor to follow his glucose and manage this.   DISCHARGE CONDITION:  Good.      Sandria Bales. Ezzard Standing, M.D.  Electronically Signed     DHN/MEDQ  D:  07/12/2007  T:  07/13/2007  Job:  [id number]   cc:   Iva Boop, MD,FACG  [facility]  [address] [place], [place] [address]

## 2011-06-01 ENCOUNTER — Other Ambulatory Visit: Payer: Self-pay | Admitting: Lab

## 2011-06-13 ENCOUNTER — Ambulatory Visit (INDEPENDENT_AMBULATORY_CARE_PROVIDER_SITE_OTHER): Payer: Medicare HMO

## 2011-06-13 ENCOUNTER — Other Ambulatory Visit (INDEPENDENT_AMBULATORY_CARE_PROVIDER_SITE_OTHER): Payer: Medicare HMO

## 2011-06-13 DIAGNOSIS — IMO0002 Reserved for concepts with insufficient information to code with codable children: Secondary | ICD-10-CM

## 2011-06-13 DIAGNOSIS — R0989 Other specified symptoms and signs involving the circulatory and respiratory systems: Secondary | ICD-10-CM

## 2011-07-19 LAB — COMPREHENSIVE METABOLIC PANEL
ALT: 129 — ABNORMAL HIGH
ALT: 252 — ABNORMAL HIGH
AST: 40 — ABNORMAL HIGH
AST: 41 — ABNORMAL HIGH
AST: 469 — ABNORMAL HIGH
Albumin: 4
Alkaline Phosphatase: 173 — ABNORMAL HIGH
BUN: 13
CO2: 25
CO2: 26
CO2: 26
Calcium: 8.1 — ABNORMAL LOW
Calcium: 8.4
Calcium: 8.6
Calcium: 8.9
Chloride: 103
Chloride: 106
Chloride: 97
Creatinine, Ser: 0.99
Creatinine, Ser: 1.01
Creatinine, Ser: 1.03
GFR calc Af Amer: >60
GFR calc Af Amer: >60
GFR calc Af Amer: >60
GFR calc non Af Amer: >60
GFR calc non Af Amer: >60
GFR calc non Af Amer: >60
Glucose, Bld: 140 — ABNORMAL HIGH
Glucose, Bld: 158 — ABNORMAL HIGH
Glucose, Bld: 174 — ABNORMAL HIGH
Potassium: 3.9
Sodium: 134 — ABNORMAL LOW
Sodium: 138
Total Bilirubin: 6.4 — ABNORMAL HIGH
Total Bilirubin: 6.9 — ABNORMAL HIGH
Total Protein: 6.5

## 2011-07-19 LAB — DIFFERENTIAL
Basophils Relative: 0
Eosinophils Absolute: 0
Eosinophils Relative: 0
Eosinophils Relative: 0
Lymphocytes Relative: 1 — ABNORMAL LOW
Lymphs Abs: 0.1 — ABNORMAL LOW
Lymphs Abs: 0.5 — ABNORMAL LOW
Monocytes Absolute: 0.2
Monocytes Relative: 5
Neutrophils Relative %: 83 — ABNORMAL HIGH

## 2011-07-19 LAB — CBC
HCT: 33.9 — ABNORMAL LOW
Hemoglobin: 12 — ABNORMAL LOW
Hemoglobin: 12.2 — ABNORMAL LOW
MCHC: 34.5
MCHC: 34.9
MCHC: 35
MCV: 88.5
MCV: 89.4
MCV: 89.4
Platelets: 141 — ABNORMAL LOW
RBC: 3.79 — ABNORMAL LOW
RBC: 3.89 — ABNORMAL LOW
RBC: 3.95 — ABNORMAL LOW
RDW: 14
WBC: 4.5
WBC: 7.6

## 2011-07-19 LAB — PROTIME-INR: Prothrombin Time: 15.3 — ABNORMAL HIGH

## 2011-07-19 LAB — AMYLASE: Amylase: 47

## 2011-07-19 LAB — HEMOGLOBIN A1C: Hgb A1c MFr Bld: 6.7 — ABNORMAL HIGH

## 2015-07-12 DIAGNOSIS — E119 Type 2 diabetes mellitus without complications: Secondary | ICD-10-CM | POA: Diagnosis not present

## 2015-07-12 DIAGNOSIS — E1165 Type 2 diabetes mellitus with hyperglycemia: Secondary | ICD-10-CM | POA: Diagnosis not present

## 2015-07-14 DIAGNOSIS — M1990 Unspecified osteoarthritis, unspecified site: Secondary | ICD-10-CM | POA: Diagnosis not present

## 2015-08-31 DIAGNOSIS — Z23 Encounter for immunization: Secondary | ICD-10-CM | POA: Diagnosis not present

## 2015-10-18 DIAGNOSIS — E119 Type 2 diabetes mellitus without complications: Secondary | ICD-10-CM | POA: Diagnosis not present

## 2015-10-18 DIAGNOSIS — H1851 Endothelial corneal dystrophy: Secondary | ICD-10-CM | POA: Diagnosis not present

## 2015-10-18 DIAGNOSIS — Z961 Presence of intraocular lens: Secondary | ICD-10-CM | POA: Diagnosis not present

## 2015-11-07 DIAGNOSIS — E119 Type 2 diabetes mellitus without complications: Secondary | ICD-10-CM | POA: Diagnosis not present

## 2015-11-07 DIAGNOSIS — Z135 Encounter for screening for eye and ear disorders: Secondary | ICD-10-CM | POA: Diagnosis not present

## 2015-11-07 DIAGNOSIS — Z125 Encounter for screening for malignant neoplasm of prostate: Secondary | ICD-10-CM | POA: Diagnosis not present

## 2015-11-07 DIAGNOSIS — Z79899 Other long term (current) drug therapy: Secondary | ICD-10-CM | POA: Diagnosis not present

## 2015-11-09 DIAGNOSIS — H9193 Unspecified hearing loss, bilateral: Secondary | ICD-10-CM | POA: Diagnosis not present

## 2015-11-14 DIAGNOSIS — Z0000 Encounter for general adult medical examination without abnormal findings: Secondary | ICD-10-CM | POA: Diagnosis not present

## 2016-02-16 DIAGNOSIS — M1711 Unilateral primary osteoarthritis, right knee: Secondary | ICD-10-CM | POA: Diagnosis not present

## 2016-03-28 DIAGNOSIS — E1142 Type 2 diabetes mellitus with diabetic polyneuropathy: Secondary | ICD-10-CM | POA: Diagnosis not present

## 2016-03-28 DIAGNOSIS — L97511 Non-pressure chronic ulcer of other part of right foot limited to breakdown of skin: Secondary | ICD-10-CM | POA: Diagnosis not present

## 2016-04-02 DIAGNOSIS — E1142 Type 2 diabetes mellitus with diabetic polyneuropathy: Secondary | ICD-10-CM | POA: Diagnosis not present

## 2016-04-02 DIAGNOSIS — L97511 Non-pressure chronic ulcer of other part of right foot limited to breakdown of skin: Secondary | ICD-10-CM | POA: Diagnosis not present

## 2016-04-23 DIAGNOSIS — L821 Other seborrheic keratosis: Secondary | ICD-10-CM | POA: Diagnosis not present

## 2016-04-23 DIAGNOSIS — C4441 Basal cell carcinoma of skin of scalp and neck: Secondary | ICD-10-CM | POA: Diagnosis not present

## 2016-04-23 DIAGNOSIS — D485 Neoplasm of uncertain behavior of skin: Secondary | ICD-10-CM | POA: Diagnosis not present

## 2016-04-23 DIAGNOSIS — C44319 Basal cell carcinoma of skin of other parts of face: Secondary | ICD-10-CM | POA: Diagnosis not present

## 2016-04-23 DIAGNOSIS — Z85828 Personal history of other malignant neoplasm of skin: Secondary | ICD-10-CM | POA: Diagnosis not present

## 2016-04-23 DIAGNOSIS — C44519 Basal cell carcinoma of skin of other part of trunk: Secondary | ICD-10-CM | POA: Diagnosis not present

## 2016-04-23 DIAGNOSIS — D1801 Hemangioma of skin and subcutaneous tissue: Secondary | ICD-10-CM | POA: Diagnosis not present

## 2016-05-03 DIAGNOSIS — Z85828 Personal history of other malignant neoplasm of skin: Secondary | ICD-10-CM | POA: Diagnosis not present

## 2016-05-03 DIAGNOSIS — C44319 Basal cell carcinoma of skin of other parts of face: Secondary | ICD-10-CM | POA: Diagnosis not present

## 2016-05-07 DIAGNOSIS — E1142 Type 2 diabetes mellitus with diabetic polyneuropathy: Secondary | ICD-10-CM | POA: Diagnosis not present

## 2016-05-07 DIAGNOSIS — L97511 Non-pressure chronic ulcer of other part of right foot limited to breakdown of skin: Secondary | ICD-10-CM | POA: Diagnosis not present

## 2016-05-24 DIAGNOSIS — L98499 Non-pressure chronic ulcer of skin of other sites with unspecified severity: Secondary | ICD-10-CM | POA: Diagnosis not present

## 2016-05-24 DIAGNOSIS — M2040 Other hammer toe(s) (acquired), unspecified foot: Secondary | ICD-10-CM | POA: Diagnosis not present

## 2016-05-24 DIAGNOSIS — L84 Corns and callosities: Secondary | ICD-10-CM | POA: Diagnosis not present

## 2016-05-24 DIAGNOSIS — E1142 Type 2 diabetes mellitus with diabetic polyneuropathy: Secondary | ICD-10-CM | POA: Diagnosis not present

## 2016-07-06 DIAGNOSIS — L84 Corns and callosities: Secondary | ICD-10-CM | POA: Diagnosis not present

## 2016-07-06 DIAGNOSIS — M2041 Other hammer toe(s) (acquired), right foot: Secondary | ICD-10-CM | POA: Diagnosis not present

## 2016-07-06 DIAGNOSIS — E1142 Type 2 diabetes mellitus with diabetic polyneuropathy: Secondary | ICD-10-CM | POA: Diagnosis not present

## 2016-07-06 DIAGNOSIS — M2042 Other hammer toe(s) (acquired), left foot: Secondary | ICD-10-CM | POA: Diagnosis not present

## 2016-07-12 DIAGNOSIS — M1711 Unilateral primary osteoarthritis, right knee: Secondary | ICD-10-CM | POA: Diagnosis not present

## 2016-07-12 DIAGNOSIS — M2241 Chondromalacia patellae, right knee: Secondary | ICD-10-CM | POA: Diagnosis not present

## 2016-07-17 DIAGNOSIS — M1711 Unilateral primary osteoarthritis, right knee: Secondary | ICD-10-CM | POA: Diagnosis not present

## 2016-07-23 DIAGNOSIS — Z23 Encounter for immunization: Secondary | ICD-10-CM | POA: Diagnosis not present

## 2016-08-13 DIAGNOSIS — Z23 Encounter for immunization: Secondary | ICD-10-CM | POA: Diagnosis not present

## 2016-09-03 DIAGNOSIS — M2042 Other hammer toe(s) (acquired), left foot: Secondary | ICD-10-CM | POA: Diagnosis not present

## 2016-09-03 DIAGNOSIS — M2041 Other hammer toe(s) (acquired), right foot: Secondary | ICD-10-CM | POA: Diagnosis not present

## 2016-09-03 DIAGNOSIS — E1142 Type 2 diabetes mellitus with diabetic polyneuropathy: Secondary | ICD-10-CM | POA: Diagnosis not present

## 2016-09-03 DIAGNOSIS — L84 Corns and callosities: Secondary | ICD-10-CM | POA: Diagnosis not present

## 2016-10-29 DIAGNOSIS — M2042 Other hammer toe(s) (acquired), left foot: Secondary | ICD-10-CM | POA: Diagnosis not present

## 2016-10-29 DIAGNOSIS — E1142 Type 2 diabetes mellitus with diabetic polyneuropathy: Secondary | ICD-10-CM | POA: Diagnosis not present

## 2016-10-29 DIAGNOSIS — M2041 Other hammer toe(s) (acquired), right foot: Secondary | ICD-10-CM | POA: Diagnosis not present

## 2016-10-29 DIAGNOSIS — L84 Corns and callosities: Secondary | ICD-10-CM | POA: Diagnosis not present

## 2016-11-06 DIAGNOSIS — Z961 Presence of intraocular lens: Secondary | ICD-10-CM | POA: Diagnosis not present

## 2016-11-06 DIAGNOSIS — H1851 Endothelial corneal dystrophy: Secondary | ICD-10-CM | POA: Diagnosis not present

## 2016-11-06 DIAGNOSIS — E119 Type 2 diabetes mellitus without complications: Secondary | ICD-10-CM | POA: Diagnosis not present

## 2016-11-16 DIAGNOSIS — Z9181 History of falling: Secondary | ICD-10-CM | POA: Diagnosis not present

## 2016-11-16 DIAGNOSIS — Z1389 Encounter for screening for other disorder: Secondary | ICD-10-CM | POA: Diagnosis not present

## 2016-11-16 DIAGNOSIS — Z0000 Encounter for general adult medical examination without abnormal findings: Secondary | ICD-10-CM | POA: Diagnosis not present

## 2016-11-16 DIAGNOSIS — E119 Type 2 diabetes mellitus without complications: Secondary | ICD-10-CM | POA: Diagnosis not present

## 2016-12-31 DIAGNOSIS — M2042 Other hammer toe(s) (acquired), left foot: Secondary | ICD-10-CM | POA: Diagnosis not present

## 2016-12-31 DIAGNOSIS — L84 Corns and callosities: Secondary | ICD-10-CM | POA: Diagnosis not present

## 2016-12-31 DIAGNOSIS — M2041 Other hammer toe(s) (acquired), right foot: Secondary | ICD-10-CM | POA: Diagnosis not present

## 2017-01-21 DIAGNOSIS — R040 Epistaxis: Secondary | ICD-10-CM | POA: Diagnosis not present

## 2017-01-21 DIAGNOSIS — J0100 Acute maxillary sinusitis, unspecified: Secondary | ICD-10-CM | POA: Diagnosis not present

## 2017-02-25 DIAGNOSIS — M2042 Other hammer toe(s) (acquired), left foot: Secondary | ICD-10-CM | POA: Diagnosis not present

## 2017-02-25 DIAGNOSIS — M2041 Other hammer toe(s) (acquired), right foot: Secondary | ICD-10-CM | POA: Diagnosis not present

## 2017-02-25 DIAGNOSIS — L84 Corns and callosities: Secondary | ICD-10-CM | POA: Diagnosis not present

## 2017-07-10 DIAGNOSIS — Z23 Encounter for immunization: Secondary | ICD-10-CM | POA: Diagnosis not present

## 2017-09-14 DIAGNOSIS — B070 Plantar wart: Secondary | ICD-10-CM | POA: Diagnosis not present

## 2017-09-19 DIAGNOSIS — L97511 Non-pressure chronic ulcer of other part of right foot limited to breakdown of skin: Secondary | ICD-10-CM | POA: Diagnosis not present

## 2017-09-19 DIAGNOSIS — E1142 Type 2 diabetes mellitus with diabetic polyneuropathy: Secondary | ICD-10-CM | POA: Diagnosis not present

## 2017-09-19 DIAGNOSIS — M216X1 Other acquired deformities of right foot: Secondary | ICD-10-CM | POA: Insufficient documentation

## 2017-09-24 DIAGNOSIS — Z87891 Personal history of nicotine dependence: Secondary | ICD-10-CM | POA: Diagnosis not present

## 2017-09-24 DIAGNOSIS — R109 Unspecified abdominal pain: Secondary | ICD-10-CM | POA: Diagnosis not present

## 2017-09-24 DIAGNOSIS — R12 Heartburn: Secondary | ICD-10-CM | POA: Diagnosis not present

## 2017-09-24 DIAGNOSIS — Z8719 Personal history of other diseases of the digestive system: Secondary | ICD-10-CM | POA: Diagnosis not present

## 2017-09-24 DIAGNOSIS — K5791 Diverticulosis of intestine, part unspecified, without perforation or abscess with bleeding: Secondary | ICD-10-CM | POA: Diagnosis not present

## 2017-09-24 DIAGNOSIS — K921 Melena: Secondary | ICD-10-CM | POA: Diagnosis not present

## 2017-09-24 DIAGNOSIS — K625 Hemorrhage of anus and rectum: Secondary | ICD-10-CM | POA: Diagnosis not present

## 2017-09-24 DIAGNOSIS — Z7982 Long term (current) use of aspirin: Secondary | ICD-10-CM | POA: Diagnosis not present

## 2017-09-24 DIAGNOSIS — K635 Polyp of colon: Secondary | ICD-10-CM | POA: Diagnosis not present

## 2017-09-24 DIAGNOSIS — I10 Essential (primary) hypertension: Secondary | ICD-10-CM | POA: Diagnosis not present

## 2017-09-24 DIAGNOSIS — D124 Benign neoplasm of descending colon: Secondary | ICD-10-CM | POA: Diagnosis not present

## 2017-09-24 DIAGNOSIS — K6389 Other specified diseases of intestine: Secondary | ICD-10-CM | POA: Diagnosis not present

## 2017-09-24 DIAGNOSIS — R001 Bradycardia, unspecified: Secondary | ICD-10-CM | POA: Diagnosis not present

## 2017-09-24 DIAGNOSIS — K641 Second degree hemorrhoids: Secondary | ICD-10-CM | POA: Diagnosis not present

## 2017-09-24 DIAGNOSIS — Z9889 Other specified postprocedural states: Secondary | ICD-10-CM | POA: Diagnosis not present

## 2017-09-24 DIAGNOSIS — E119 Type 2 diabetes mellitus without complications: Secondary | ICD-10-CM | POA: Diagnosis not present

## 2017-09-24 DIAGNOSIS — E1142 Type 2 diabetes mellitus with diabetic polyneuropathy: Secondary | ICD-10-CM | POA: Diagnosis not present

## 2017-09-24 DIAGNOSIS — D122 Benign neoplasm of ascending colon: Secondary | ICD-10-CM | POA: Diagnosis not present

## 2017-09-24 DIAGNOSIS — K5909 Other constipation: Secondary | ICD-10-CM | POA: Diagnosis not present

## 2017-09-24 DIAGNOSIS — D123 Benign neoplasm of transverse colon: Secondary | ICD-10-CM | POA: Diagnosis not present

## 2017-09-24 DIAGNOSIS — K219 Gastro-esophageal reflux disease without esophagitis: Secondary | ICD-10-CM | POA: Diagnosis not present

## 2017-09-24 DIAGNOSIS — K922 Gastrointestinal hemorrhage, unspecified: Secondary | ICD-10-CM | POA: Diagnosis not present

## 2017-09-24 DIAGNOSIS — R110 Nausea: Secondary | ICD-10-CM | POA: Diagnosis not present

## 2017-09-24 DIAGNOSIS — E785 Hyperlipidemia, unspecified: Secondary | ICD-10-CM | POA: Diagnosis not present

## 2017-09-25 DIAGNOSIS — E119 Type 2 diabetes mellitus without complications: Secondary | ICD-10-CM | POA: Diagnosis not present

## 2017-09-25 DIAGNOSIS — D122 Benign neoplasm of ascending colon: Secondary | ICD-10-CM | POA: Diagnosis not present

## 2017-09-25 DIAGNOSIS — Z87891 Personal history of nicotine dependence: Secondary | ICD-10-CM | POA: Diagnosis not present

## 2017-09-25 DIAGNOSIS — K6389 Other specified diseases of intestine: Secondary | ICD-10-CM | POA: Diagnosis not present

## 2017-09-25 DIAGNOSIS — K635 Polyp of colon: Secondary | ICD-10-CM | POA: Diagnosis not present

## 2017-09-25 DIAGNOSIS — D123 Benign neoplasm of transverse colon: Secondary | ICD-10-CM | POA: Diagnosis not present

## 2017-09-25 DIAGNOSIS — I10 Essential (primary) hypertension: Secondary | ICD-10-CM | POA: Diagnosis not present

## 2017-09-25 DIAGNOSIS — K219 Gastro-esophageal reflux disease without esophagitis: Secondary | ICD-10-CM | POA: Diagnosis not present

## 2017-09-25 DIAGNOSIS — E785 Hyperlipidemia, unspecified: Secondary | ICD-10-CM | POA: Diagnosis not present

## 2017-09-25 DIAGNOSIS — K641 Second degree hemorrhoids: Secondary | ICD-10-CM | POA: Diagnosis not present

## 2017-09-25 DIAGNOSIS — K5791 Diverticulosis of intestine, part unspecified, without perforation or abscess with bleeding: Secondary | ICD-10-CM | POA: Diagnosis not present

## 2017-09-25 DIAGNOSIS — K625 Hemorrhage of anus and rectum: Secondary | ICD-10-CM | POA: Diagnosis not present

## 2017-09-25 DIAGNOSIS — R001 Bradycardia, unspecified: Secondary | ICD-10-CM | POA: Diagnosis not present

## 2017-09-25 DIAGNOSIS — K921 Melena: Secondary | ICD-10-CM | POA: Diagnosis not present

## 2017-09-25 DIAGNOSIS — D124 Benign neoplasm of descending colon: Secondary | ICD-10-CM | POA: Diagnosis not present

## 2017-09-25 DIAGNOSIS — E1142 Type 2 diabetes mellitus with diabetic polyneuropathy: Secondary | ICD-10-CM | POA: Diagnosis not present

## 2017-09-26 DIAGNOSIS — K922 Gastrointestinal hemorrhage, unspecified: Secondary | ICD-10-CM | POA: Diagnosis not present

## 2017-09-26 DIAGNOSIS — E1142 Type 2 diabetes mellitus with diabetic polyneuropathy: Secondary | ICD-10-CM | POA: Diagnosis not present

## 2017-09-26 DIAGNOSIS — K625 Hemorrhage of anus and rectum: Secondary | ICD-10-CM | POA: Diagnosis not present

## 2017-09-26 DIAGNOSIS — I10 Essential (primary) hypertension: Secondary | ICD-10-CM | POA: Diagnosis not present

## 2017-10-09 DIAGNOSIS — M2042 Other hammer toe(s) (acquired), left foot: Secondary | ICD-10-CM | POA: Diagnosis not present

## 2017-10-09 DIAGNOSIS — L84 Corns and callosities: Secondary | ICD-10-CM | POA: Diagnosis not present

## 2017-10-09 DIAGNOSIS — M2041 Other hammer toe(s) (acquired), right foot: Secondary | ICD-10-CM | POA: Diagnosis not present

## 2017-10-09 DIAGNOSIS — L97511 Non-pressure chronic ulcer of other part of right foot limited to breakdown of skin: Secondary | ICD-10-CM | POA: Diagnosis not present

## 2017-10-09 DIAGNOSIS — M216X1 Other acquired deformities of right foot: Secondary | ICD-10-CM | POA: Diagnosis not present

## 2017-11-04 DIAGNOSIS — M216X1 Other acquired deformities of right foot: Secondary | ICD-10-CM | POA: Diagnosis not present

## 2017-11-04 DIAGNOSIS — M2041 Other hammer toe(s) (acquired), right foot: Secondary | ICD-10-CM | POA: Diagnosis not present

## 2017-11-04 DIAGNOSIS — M2042 Other hammer toe(s) (acquired), left foot: Secondary | ICD-10-CM | POA: Diagnosis not present

## 2017-11-04 DIAGNOSIS — L84 Corns and callosities: Secondary | ICD-10-CM | POA: Diagnosis not present

## 2017-11-19 DIAGNOSIS — M216X1 Other acquired deformities of right foot: Secondary | ICD-10-CM | POA: Diagnosis not present

## 2017-11-19 DIAGNOSIS — M2041 Other hammer toe(s) (acquired), right foot: Secondary | ICD-10-CM | POA: Diagnosis not present

## 2017-11-19 DIAGNOSIS — S91301A Unspecified open wound, right foot, initial encounter: Secondary | ICD-10-CM | POA: Diagnosis not present

## 2017-11-19 DIAGNOSIS — L03115 Cellulitis of right lower limb: Secondary | ICD-10-CM | POA: Diagnosis not present

## 2017-11-19 DIAGNOSIS — M2042 Other hammer toe(s) (acquired), left foot: Secondary | ICD-10-CM | POA: Diagnosis not present

## 2017-11-20 DIAGNOSIS — L03115 Cellulitis of right lower limb: Secondary | ICD-10-CM | POA: Diagnosis not present

## 2017-11-21 DIAGNOSIS — Z0000 Encounter for general adult medical examination without abnormal findings: Secondary | ICD-10-CM | POA: Diagnosis not present

## 2017-11-21 DIAGNOSIS — Z9181 History of falling: Secondary | ICD-10-CM | POA: Diagnosis not present

## 2017-11-21 DIAGNOSIS — Z6827 Body mass index (BMI) 27.0-27.9, adult: Secondary | ICD-10-CM | POA: Diagnosis not present

## 2017-11-21 DIAGNOSIS — E119 Type 2 diabetes mellitus without complications: Secondary | ICD-10-CM | POA: Diagnosis not present

## 2017-11-21 DIAGNOSIS — Z1331 Encounter for screening for depression: Secondary | ICD-10-CM | POA: Diagnosis not present

## 2017-11-21 DIAGNOSIS — Z1339 Encounter for screening examination for other mental health and behavioral disorders: Secondary | ICD-10-CM | POA: Diagnosis not present

## 2017-11-21 DIAGNOSIS — Z79899 Other long term (current) drug therapy: Secondary | ICD-10-CM | POA: Diagnosis not present

## 2017-11-21 DIAGNOSIS — E785 Hyperlipidemia, unspecified: Secondary | ICD-10-CM | POA: Diagnosis not present

## 2017-12-02 DIAGNOSIS — M216X1 Other acquired deformities of right foot: Secondary | ICD-10-CM | POA: Diagnosis not present

## 2017-12-02 DIAGNOSIS — S91301D Unspecified open wound, right foot, subsequent encounter: Secondary | ICD-10-CM | POA: Diagnosis not present

## 2017-12-02 DIAGNOSIS — L03115 Cellulitis of right lower limb: Secondary | ICD-10-CM | POA: Diagnosis not present

## 2017-12-30 DIAGNOSIS — M2042 Other hammer toe(s) (acquired), left foot: Secondary | ICD-10-CM | POA: Diagnosis not present

## 2017-12-30 DIAGNOSIS — M216X1 Other acquired deformities of right foot: Secondary | ICD-10-CM | POA: Diagnosis not present

## 2017-12-30 DIAGNOSIS — M2041 Other hammer toe(s) (acquired), right foot: Secondary | ICD-10-CM | POA: Diagnosis not present

## 2017-12-30 DIAGNOSIS — L84 Corns and callosities: Secondary | ICD-10-CM | POA: Diagnosis not present

## 2018-01-31 DIAGNOSIS — L84 Corns and callosities: Secondary | ICD-10-CM | POA: Diagnosis not present

## 2018-01-31 DIAGNOSIS — M2042 Other hammer toe(s) (acquired), left foot: Secondary | ICD-10-CM | POA: Diagnosis not present

## 2018-01-31 DIAGNOSIS — M216X1 Other acquired deformities of right foot: Secondary | ICD-10-CM | POA: Diagnosis not present

## 2018-01-31 DIAGNOSIS — M2041 Other hammer toe(s) (acquired), right foot: Secondary | ICD-10-CM | POA: Diagnosis not present

## 2018-01-31 DIAGNOSIS — L03031 Cellulitis of right toe: Secondary | ICD-10-CM | POA: Insufficient documentation

## 2018-02-07 DIAGNOSIS — L03031 Cellulitis of right toe: Secondary | ICD-10-CM | POA: Diagnosis not present

## 2018-02-14 DIAGNOSIS — Z4889 Encounter for other specified surgical aftercare: Secondary | ICD-10-CM | POA: Insufficient documentation

## 2018-03-22 DIAGNOSIS — Z87891 Personal history of nicotine dependence: Secondary | ICD-10-CM | POA: Diagnosis not present

## 2018-03-22 DIAGNOSIS — M6281 Muscle weakness (generalized): Secondary | ICD-10-CM | POA: Diagnosis not present

## 2018-03-22 DIAGNOSIS — W1830XA Fall on same level, unspecified, initial encounter: Secondary | ICD-10-CM | POA: Diagnosis not present

## 2018-03-22 DIAGNOSIS — Z7409 Other reduced mobility: Secondary | ICD-10-CM | POA: Diagnosis not present

## 2018-03-22 DIAGNOSIS — R29810 Facial weakness: Secondary | ICD-10-CM | POA: Diagnosis not present

## 2018-03-22 DIAGNOSIS — I10 Essential (primary) hypertension: Secondary | ICD-10-CM | POA: Diagnosis not present

## 2018-03-22 DIAGNOSIS — R739 Hyperglycemia, unspecified: Secondary | ICD-10-CM | POA: Diagnosis not present

## 2018-03-22 DIAGNOSIS — Z79899 Other long term (current) drug therapy: Secondary | ICD-10-CM | POA: Diagnosis not present

## 2018-03-22 DIAGNOSIS — R531 Weakness: Secondary | ICD-10-CM | POA: Diagnosis not present

## 2018-03-22 DIAGNOSIS — I081 Rheumatic disorders of both mitral and tricuspid valves: Secondary | ICD-10-CM | POA: Diagnosis not present

## 2018-03-22 DIAGNOSIS — E785 Hyperlipidemia, unspecified: Secondary | ICD-10-CM | POA: Insufficient documentation

## 2018-03-22 DIAGNOSIS — I639 Cerebral infarction, unspecified: Secondary | ICD-10-CM | POA: Diagnosis not present

## 2018-03-22 DIAGNOSIS — I6523 Occlusion and stenosis of bilateral carotid arteries: Secondary | ICD-10-CM | POA: Diagnosis not present

## 2018-03-22 DIAGNOSIS — Z7982 Long term (current) use of aspirin: Secondary | ICD-10-CM | POA: Diagnosis not present

## 2018-03-22 DIAGNOSIS — R269 Unspecified abnormalities of gait and mobility: Secondary | ICD-10-CM | POA: Diagnosis not present

## 2018-03-23 DIAGNOSIS — R531 Weakness: Secondary | ICD-10-CM | POA: Diagnosis not present

## 2018-03-23 DIAGNOSIS — E785 Hyperlipidemia, unspecified: Secondary | ICD-10-CM | POA: Diagnosis not present

## 2018-03-23 DIAGNOSIS — I10 Essential (primary) hypertension: Secondary | ICD-10-CM | POA: Diagnosis not present

## 2018-03-23 DIAGNOSIS — R29810 Facial weakness: Secondary | ICD-10-CM | POA: Diagnosis not present

## 2018-03-24 DIAGNOSIS — Z87891 Personal history of nicotine dependence: Secondary | ICD-10-CM | POA: Diagnosis not present

## 2018-03-24 DIAGNOSIS — Z79899 Other long term (current) drug therapy: Secondary | ICD-10-CM | POA: Diagnosis not present

## 2018-03-24 DIAGNOSIS — I10 Essential (primary) hypertension: Secondary | ICD-10-CM | POA: Diagnosis not present

## 2018-03-24 DIAGNOSIS — R001 Bradycardia, unspecified: Secondary | ICD-10-CM | POA: Diagnosis not present

## 2018-03-24 DIAGNOSIS — R29810 Facial weakness: Secondary | ICD-10-CM | POA: Diagnosis not present

## 2018-03-24 DIAGNOSIS — Z8673 Personal history of transient ischemic attack (TIA), and cerebral infarction without residual deficits: Secondary | ICD-10-CM | POA: Diagnosis not present

## 2018-03-24 DIAGNOSIS — I639 Cerebral infarction, unspecified: Secondary | ICD-10-CM | POA: Diagnosis not present

## 2018-03-24 DIAGNOSIS — M6281 Muscle weakness (generalized): Secondary | ICD-10-CM | POA: Diagnosis not present

## 2018-03-24 DIAGNOSIS — Z7982 Long term (current) use of aspirin: Secondary | ICD-10-CM | POA: Diagnosis not present

## 2018-03-24 DIAGNOSIS — E785 Hyperlipidemia, unspecified: Secondary | ICD-10-CM | POA: Diagnosis not present

## 2018-04-15 DIAGNOSIS — I639 Cerebral infarction, unspecified: Secondary | ICD-10-CM | POA: Diagnosis not present

## 2018-07-05 DIAGNOSIS — Z23 Encounter for immunization: Secondary | ICD-10-CM | POA: Diagnosis not present

## 2018-08-13 DIAGNOSIS — E785 Hyperlipidemia, unspecified: Secondary | ICD-10-CM | POA: Diagnosis not present

## 2018-08-13 DIAGNOSIS — Z6827 Body mass index (BMI) 27.0-27.9, adult: Secondary | ICD-10-CM | POA: Diagnosis not present

## 2018-08-13 DIAGNOSIS — K219 Gastro-esophageal reflux disease without esophagitis: Secondary | ICD-10-CM | POA: Diagnosis not present

## 2018-08-13 DIAGNOSIS — K4090 Unilateral inguinal hernia, without obstruction or gangrene, not specified as recurrent: Secondary | ICD-10-CM | POA: Diagnosis not present

## 2018-08-13 DIAGNOSIS — E7439 Other disorders of intestinal carbohydrate absorption: Secondary | ICD-10-CM | POA: Diagnosis not present

## 2018-08-13 DIAGNOSIS — Z79899 Other long term (current) drug therapy: Secondary | ICD-10-CM | POA: Diagnosis not present

## 2018-08-15 DIAGNOSIS — K4090 Unilateral inguinal hernia, without obstruction or gangrene, not specified as recurrent: Secondary | ICD-10-CM | POA: Diagnosis not present

## 2018-09-02 DIAGNOSIS — M1990 Unspecified osteoarthritis, unspecified site: Secondary | ICD-10-CM | POA: Diagnosis not present

## 2018-09-02 DIAGNOSIS — Z6827 Body mass index (BMI) 27.0-27.9, adult: Secondary | ICD-10-CM | POA: Diagnosis not present

## 2018-09-02 DIAGNOSIS — E1129 Type 2 diabetes mellitus with other diabetic kidney complication: Secondary | ICD-10-CM | POA: Diagnosis not present

## 2018-09-02 DIAGNOSIS — R809 Proteinuria, unspecified: Secondary | ICD-10-CM | POA: Diagnosis not present

## 2018-09-02 DIAGNOSIS — E7800 Pure hypercholesterolemia, unspecified: Secondary | ICD-10-CM | POA: Diagnosis not present

## 2018-09-08 DIAGNOSIS — D176 Benign lipomatous neoplasm of spermatic cord: Secondary | ICD-10-CM | POA: Diagnosis not present

## 2018-09-08 DIAGNOSIS — R001 Bradycardia, unspecified: Secondary | ICD-10-CM | POA: Diagnosis not present

## 2018-09-08 DIAGNOSIS — E785 Hyperlipidemia, unspecified: Secondary | ICD-10-CM | POA: Diagnosis not present

## 2018-09-08 DIAGNOSIS — K4090 Unilateral inguinal hernia, without obstruction or gangrene, not specified as recurrent: Secondary | ICD-10-CM | POA: Diagnosis not present

## 2018-09-08 DIAGNOSIS — E119 Type 2 diabetes mellitus without complications: Secondary | ICD-10-CM | POA: Diagnosis not present

## 2018-09-08 DIAGNOSIS — Z8673 Personal history of transient ischemic attack (TIA), and cerebral infarction without residual deficits: Secondary | ICD-10-CM | POA: Diagnosis not present

## 2018-09-08 DIAGNOSIS — Z79899 Other long term (current) drug therapy: Secondary | ICD-10-CM | POA: Diagnosis not present

## 2018-09-08 DIAGNOSIS — Z01810 Encounter for preprocedural cardiovascular examination: Secondary | ICD-10-CM | POA: Diagnosis not present

## 2018-09-08 DIAGNOSIS — Z7984 Long term (current) use of oral hypoglycemic drugs: Secondary | ICD-10-CM | POA: Diagnosis not present

## 2018-09-08 DIAGNOSIS — Z01818 Encounter for other preprocedural examination: Secondary | ICD-10-CM | POA: Diagnosis not present

## 2018-09-08 DIAGNOSIS — Z7982 Long term (current) use of aspirin: Secondary | ICD-10-CM | POA: Diagnosis not present

## 2018-09-11 DIAGNOSIS — M1711 Unilateral primary osteoarthritis, right knee: Secondary | ICD-10-CM | POA: Diagnosis not present

## 2018-09-11 DIAGNOSIS — Z09 Encounter for follow-up examination after completed treatment for conditions other than malignant neoplasm: Secondary | ICD-10-CM | POA: Diagnosis not present

## 2018-09-23 DIAGNOSIS — Z09 Encounter for follow-up examination after completed treatment for conditions other than malignant neoplasm: Secondary | ICD-10-CM | POA: Diagnosis not present

## 2018-09-23 DIAGNOSIS — Z6825 Body mass index (BMI) 25.0-25.9, adult: Secondary | ICD-10-CM | POA: Diagnosis not present

## 2018-12-09 DIAGNOSIS — Z1331 Encounter for screening for depression: Secondary | ICD-10-CM | POA: Diagnosis not present

## 2018-12-09 DIAGNOSIS — Z6827 Body mass index (BMI) 27.0-27.9, adult: Secondary | ICD-10-CM | POA: Diagnosis not present

## 2018-12-09 DIAGNOSIS — Z79899 Other long term (current) drug therapy: Secondary | ICD-10-CM | POA: Diagnosis not present

## 2018-12-09 DIAGNOSIS — K219 Gastro-esophageal reflux disease without esophagitis: Secondary | ICD-10-CM | POA: Diagnosis not present

## 2018-12-09 DIAGNOSIS — Z9181 History of falling: Secondary | ICD-10-CM | POA: Diagnosis not present

## 2018-12-09 DIAGNOSIS — E785 Hyperlipidemia, unspecified: Secondary | ICD-10-CM | POA: Diagnosis not present

## 2018-12-09 DIAGNOSIS — Z0000 Encounter for general adult medical examination without abnormal findings: Secondary | ICD-10-CM | POA: Diagnosis not present

## 2019-02-07 DIAGNOSIS — L97511 Non-pressure chronic ulcer of other part of right foot limited to breakdown of skin: Secondary | ICD-10-CM | POA: Diagnosis not present

## 2019-02-07 DIAGNOSIS — L0390 Cellulitis, unspecified: Secondary | ICD-10-CM | POA: Diagnosis not present

## 2019-02-09 DIAGNOSIS — K219 Gastro-esophageal reflux disease without esophagitis: Secondary | ICD-10-CM | POA: Diagnosis not present

## 2019-02-09 DIAGNOSIS — L089 Local infection of the skin and subcutaneous tissue, unspecified: Secondary | ICD-10-CM

## 2019-02-09 DIAGNOSIS — E119 Type 2 diabetes mellitus without complications: Secondary | ICD-10-CM | POA: Diagnosis not present

## 2019-02-09 DIAGNOSIS — M009 Pyogenic arthritis, unspecified: Secondary | ICD-10-CM | POA: Diagnosis not present

## 2019-02-09 DIAGNOSIS — L03115 Cellulitis of right lower limb: Secondary | ICD-10-CM | POA: Diagnosis not present

## 2019-02-09 DIAGNOSIS — E1169 Type 2 diabetes mellitus with other specified complication: Secondary | ICD-10-CM | POA: Diagnosis not present

## 2019-02-09 DIAGNOSIS — E785 Hyperlipidemia, unspecified: Secondary | ICD-10-CM | POA: Diagnosis not present

## 2019-02-09 DIAGNOSIS — M71071 Abscess of bursa, right ankle and foot: Secondary | ICD-10-CM

## 2019-02-09 DIAGNOSIS — L02611 Cutaneous abscess of right foot: Secondary | ICD-10-CM | POA: Diagnosis not present

## 2019-02-09 DIAGNOSIS — E1165 Type 2 diabetes mellitus with hyperglycemia: Secondary | ICD-10-CM | POA: Diagnosis not present

## 2019-02-09 DIAGNOSIS — M79671 Pain in right foot: Secondary | ICD-10-CM | POA: Diagnosis not present

## 2019-02-09 DIAGNOSIS — M7989 Other specified soft tissue disorders: Secondary | ICD-10-CM | POA: Diagnosis not present

## 2019-02-09 DIAGNOSIS — N179 Acute kidney failure, unspecified: Secondary | ICD-10-CM | POA: Diagnosis not present

## 2019-02-09 DIAGNOSIS — L0390 Cellulitis, unspecified: Secondary | ICD-10-CM | POA: Diagnosis not present

## 2019-02-09 DIAGNOSIS — M86171 Other acute osteomyelitis, right ankle and foot: Secondary | ICD-10-CM

## 2019-02-09 DIAGNOSIS — E11628 Type 2 diabetes mellitus with other skin complications: Secondary | ICD-10-CM

## 2019-02-09 DIAGNOSIS — Z7982 Long term (current) use of aspirin: Secondary | ICD-10-CM | POA: Diagnosis not present

## 2019-02-09 DIAGNOSIS — B9689 Other specified bacterial agents as the cause of diseases classified elsewhere: Secondary | ICD-10-CM | POA: Diagnosis not present

## 2019-02-09 DIAGNOSIS — Z79899 Other long term (current) drug therapy: Secondary | ICD-10-CM | POA: Diagnosis not present

## 2019-02-09 DIAGNOSIS — M869 Osteomyelitis, unspecified: Secondary | ICD-10-CM | POA: Diagnosis not present

## 2019-02-09 DIAGNOSIS — R0989 Other specified symptoms and signs involving the circulatory and respiratory systems: Secondary | ICD-10-CM | POA: Diagnosis not present

## 2019-02-09 DIAGNOSIS — I10 Essential (primary) hypertension: Secondary | ICD-10-CM | POA: Diagnosis not present

## 2019-02-10 DIAGNOSIS — L0390 Cellulitis, unspecified: Secondary | ICD-10-CM

## 2019-02-10 DIAGNOSIS — E119 Type 2 diabetes mellitus without complications: Secondary | ICD-10-CM

## 2019-02-12 DIAGNOSIS — Z7982 Long term (current) use of aspirin: Secondary | ICD-10-CM | POA: Diagnosis not present

## 2019-02-12 DIAGNOSIS — M86171 Other acute osteomyelitis, right ankle and foot: Secondary | ICD-10-CM | POA: Diagnosis not present

## 2019-02-12 DIAGNOSIS — E1169 Type 2 diabetes mellitus with other specified complication: Secondary | ICD-10-CM | POA: Diagnosis not present

## 2019-02-12 DIAGNOSIS — L02611 Cutaneous abscess of right foot: Secondary | ICD-10-CM | POA: Diagnosis not present

## 2019-02-12 DIAGNOSIS — Z4800 Encounter for change or removal of nonsurgical wound dressing: Secondary | ICD-10-CM | POA: Diagnosis not present

## 2019-02-12 DIAGNOSIS — E1151 Type 2 diabetes mellitus with diabetic peripheral angiopathy without gangrene: Secondary | ICD-10-CM | POA: Diagnosis not present

## 2019-02-12 DIAGNOSIS — N179 Acute kidney failure, unspecified: Secondary | ICD-10-CM | POA: Diagnosis not present

## 2019-02-12 DIAGNOSIS — E785 Hyperlipidemia, unspecified: Secondary | ICD-10-CM | POA: Diagnosis not present

## 2019-02-13 ENCOUNTER — Ambulatory Visit: Payer: Medicare HMO | Admitting: Sports Medicine

## 2019-02-16 ENCOUNTER — Telehealth: Payer: Self-pay | Admitting: *Deleted

## 2019-02-16 NOTE — Telephone Encounter (Signed)
[place] states they have opened pt into their care aftrer 02/10/2019 surgery with Dr. March Rummage, the are going to perform the wound care orders as described in the discharge orders, but would like to change from every other day to Monday, Wednesday, Friday.

## 2019-02-17 ENCOUNTER — Other Ambulatory Visit: Payer: Self-pay

## 2019-02-17 ENCOUNTER — Ambulatory Visit (INDEPENDENT_AMBULATORY_CARE_PROVIDER_SITE_OTHER): Payer: Self-pay | Admitting: Podiatry

## 2019-02-17 ENCOUNTER — Ambulatory Visit (INDEPENDENT_AMBULATORY_CARE_PROVIDER_SITE_OTHER): Payer: Medicare HMO

## 2019-02-17 DIAGNOSIS — T148XXA Other injury of unspecified body region, initial encounter: Secondary | ICD-10-CM | POA: Diagnosis not present

## 2019-02-17 DIAGNOSIS — Z9889 Other specified postprocedural states: Secondary | ICD-10-CM

## 2019-02-17 NOTE — Progress Notes (Signed)
  Subjective:  Patient ID: Harold Rogers, male    DOB: May 17, 1935,  MRN: [id number]  No chief complaint on file.   DOS: 02/11/2019 Procedure: Incision and Drainage R Foot abscess, 2nd metatarsal resection, 3rd MPJ Arthrotomy  84 y.o. male returns for post-op check. Doing well denies pain or issues.  Review of Systems: Negative except as noted in the HPI. Denies N/V/F/Ch.  No past medical history on file. No current outpatient medications on file.  Social History   Tobacco Use  Smoking Status Not on file    Allergies not on file Objective:  There were no vitals filed for this visit. There is no height or weight on file to calculate BMI. Constitutional Well developed. Well nourished.  Vascular Foot warm and well perfused. Capillary refill normal to all digits.   Neurologic Normal speech. Oriented to person, place, and time. Epicritic sensation to light touch grossly present bilaterally.  Dermatologic Wound healing well without signs of infection. Sutures intact. Early hyperkeratosis submet 3 right   Orthopedic: Tenderness to palpation noted about the surgical site.   Radiographs: None Assessment:   1. Post-operative state    Plan:  Patient was evaluated and treated and all questions answered.  S/p foot surgery right -Progressing as expected post-operatively. -XR: None -WB Status: WBAt in surgical shoe -Sutures: intact. -Medications: none refilled. -Foot redressed.  No follow-ups on file.

## 2019-02-17 NOTE — Telephone Encounter (Signed)
Left message for [name] to update and change home health orders.  Okay to change to Mom, Wed, Fri dressing changes continue to pack with aquacel   Add home PT Eval and treat for strengthening

## 2019-02-19 ENCOUNTER — Other Ambulatory Visit: Payer: Self-pay | Admitting: Podiatry

## 2019-02-19 ENCOUNTER — Other Ambulatory Visit: Payer: Self-pay

## 2019-02-19 DIAGNOSIS — Z9889 Other specified postprocedural states: Secondary | ICD-10-CM

## 2019-02-19 DIAGNOSIS — L24A9 Irritant contact dermatitis due friction or contact with other specified body fluids: Secondary | ICD-10-CM

## 2019-02-19 DIAGNOSIS — T148XXA Other injury of unspecified body region, initial encounter: Secondary | ICD-10-CM

## 2019-02-24 ENCOUNTER — Other Ambulatory Visit: Payer: Self-pay

## 2019-02-24 ENCOUNTER — Encounter: Payer: Self-pay | Admitting: Podiatry

## 2019-02-24 ENCOUNTER — Ambulatory Visit (INDEPENDENT_AMBULATORY_CARE_PROVIDER_SITE_OTHER): Payer: TRICARE For Life (TFL) | Admitting: Podiatry

## 2019-02-24 VITALS — BP 85/57 | HR 81 | Temp 97.6°F | Resp 16

## 2019-02-24 DIAGNOSIS — L97414 Non-pressure chronic ulcer of right heel and midfoot with necrosis of bone: Secondary | ICD-10-CM

## 2019-02-24 DIAGNOSIS — E08621 Diabetes mellitus due to underlying condition with foot ulcer: Secondary | ICD-10-CM

## 2019-02-24 NOTE — Progress Notes (Signed)
Admitted at [facility] and seen as consult  DOS: 02/10/2019 Procedure: I&D R foot Abscess, R 2nd metataral partial resection, right 3rd MPJ arthrotomy for septic joint.

## 2019-02-24 NOTE — Progress Notes (Signed)
  Subjective:  Patient ID: Harold Rogers, male    DOB: 01-14-1935,  MRN: [id number]  Chief Complaint  Patient presents with  . Routine Post Op    DOS 02/11/2019 Rt foot I & D Pt. states," foot seems to e okay. The nurse says it's looking better. No pain I just feel like I have something on the arch of my foot.": Tx: sx shoe, and dressing change by [facility] -pt denies N/V?F?Ch -w/ less bloody drainage and redness   DOS: 02/11/2019 Procedure: Incision and Drainage R Foot abscess, 2nd metatarsal resection, 3rd MPJ Arthrotomy  84 y.o. male returns for post-op check. Doing well denies pain or issues.  Review of Systems: Negative except as noted in the HPI. Denies N/V/F/Ch.  No past medical history on file.  Current Outpatient Medications:  .  amoxicillin-clavulanate (AUGMENTIN) 875-125 MG tablet, Take 1 tablet by mouth 2 (two) times daily., Disp: , Rfl:  .  famotidine (PEPCID) 40 MG tablet, , Disp: , Rfl:  .  JARDIANCE 10 MG TABS tablet, Take 10 mg by mouth daily., Disp: , Rfl:  .  lisinopril (ZESTRIL) 2.5 MG tablet, , Disp: , Rfl:  .  mupirocin ointment (BACTROBAN) 2 %, APPLY CREAM TO ULCER THREE TIMES A DAILY FOR TEN DAYS, Disp: , Rfl:  .  rosuvastatin (CRESTOR) 40 MG tablet, , Disp: , Rfl:  .  sulfamethoxazole-trimethoprim (BACTRIM DS) 800-160 MG tablet, Take 1 tablet by mouth 2 (two) times daily. for 10 days, Disp: , Rfl:   Social History   Tobacco Use  Smoking Status Never Smoker  Smokeless Tobacco Never Used    Not on File Objective:   Vitals:   02/24/19 0957  BP: (!) 85/57  Pulse: 81  Resp: 16  Temp: 97.6 F (36.4 C)   There is no height or weight on file to calculate BMI. Constitutional Well developed. Well nourished.  Vascular Foot warm and well perfused. Capillary refill normal to all digits.   Neurologic Normal speech. Oriented to person, place, and time. Epicritic sensation to light touch grossly present bilaterally.  Dermatologic Wound healing well without signs of  infection. Staples intact. Early hyperkeratosis submet 3 right  Orthopedic: No tenderness to palpation noted about the surgical site.   Radiographs: None Assessment:   1. 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  S/p foot surgery right -Progressing as expected post-operatively. -XR: None -WB Status: WBAt in surgical shoe. New shoe dispensed -Wound packed. -Staples : intact. -Medications: none refilled. -Foot redressed.  -Will get rx for DM shoes to be filled at [place] at next visit.  No follow-ups on file.

## 2019-03-03 ENCOUNTER — Encounter: Payer: Self-pay | Admitting: Podiatry

## 2019-03-03 ENCOUNTER — Ambulatory Visit (INDEPENDENT_AMBULATORY_CARE_PROVIDER_SITE_OTHER): Payer: TRICARE For Life (TFL) | Admitting: Podiatry

## 2019-03-03 ENCOUNTER — Other Ambulatory Visit: Payer: Self-pay

## 2019-03-03 VITALS — Temp 97.2°F | Resp 16

## 2019-03-03 DIAGNOSIS — L97414 Non-pressure chronic ulcer of right heel and midfoot with necrosis of bone: Secondary | ICD-10-CM

## 2019-03-03 DIAGNOSIS — E08621 Diabetes mellitus due to underlying condition with foot ulcer: Secondary | ICD-10-CM

## 2019-03-03 NOTE — Progress Notes (Signed)
  Subjective:  Patient ID: Harold Rogers, male    DOB: Feb 25, 1935,  MRN: [id number]  Chief Complaint  Patient presents with  . Foot Ulcer    F/U Rt midfoot ulcer Pt. states," nurse said it's looking much beter. The swelling is my only concern." tx: sx shoe and elevation -w/ less drainage and redness -pt deneis odor/warmth/N/V/F?Ch -pt states one of the stapes fell off yesterday since it got stucked to the packing."    DOS: 02/11/2019 Procedure: Incision and Drainage R Foot abscess, 2nd metatarsal resection, 3rd MPJ Arthrotomy  84 y.o. male returns for post-op check. Doing well denies pain or issues.  History above confirmed with patient  Review of Systems: Negative except as noted in the HPI. Denies N/V/F/Ch.  No past medical history on file.  Current Outpatient Medications:  .  amoxicillin-clavulanate (AUGMENTIN) 875-125 MG tablet, Take 1 tablet by mouth 2 (two) times daily., Disp: , Rfl:  .  famotidine (PEPCID) 40 MG tablet, , Disp: , Rfl:  .  JARDIANCE 10 MG TABS tablet, Take 10 mg by mouth daily., Disp: , Rfl:  .  lisinopril (ZESTRIL) 2.5 MG tablet, , Disp: , Rfl:  .  mupirocin ointment (BACTROBAN) 2 %, APPLY CREAM TO ULCER THREE TIMES A DAILY FOR TEN DAYS, Disp: , Rfl:  .  rosuvastatin (CRESTOR) 40 MG tablet, , Disp: , Rfl:  .  sulfamethoxazole-trimethoprim (BACTRIM DS) 800-160 MG tablet, Take 1 tablet by mouth 2 (two) times daily. for 10 days, Disp: , Rfl:   Social History   Tobacco Use  Smoking Status Never Smoker  Smokeless Tobacco Never Used    Not on File Objective:   Vitals:   03/03/19 1414  Resp: 16  Temp: (!) 97.2 F (36.2 C)   There is no height or weight on file to calculate BMI. Constitutional Well developed. Well nourished.  Vascular Foot warm and well perfused. Capillary refill normal to all digits.   Neurologic Normal speech. Oriented to person, place, and time. Epicritic sensation to light touch grossly present bilaterally.  Dermatologic Wound  healing well without signs of infection. Staples intact. Early hyperkeratosis submet 3 right  Orthopedic: No tenderness to palpation noted about the surgical site.   Radiographs: None Assessment:   No diagnosis found. Plan:  Patient was evaluated and treated and all questions answered.  S/p foot surgery right -Progressing as expected post-operatively. -XR: None -WB Status: WBAt in surgical shoe -Wound repacked -Staples : Removed. -Medications: none refilled. -Foot redressed.  -We will get Rx for DM shoes for patient to be filled at [facility]  No follow-ups on file.

## 2019-03-10 ENCOUNTER — Encounter: Payer: TRICARE For Life (TFL) | Admitting: Podiatry

## 2019-03-12 ENCOUNTER — Ambulatory Visit (INDEPENDENT_AMBULATORY_CARE_PROVIDER_SITE_OTHER): Payer: Medicare HMO

## 2019-03-12 ENCOUNTER — Encounter: Payer: Self-pay | Admitting: Sports Medicine

## 2019-03-12 ENCOUNTER — Other Ambulatory Visit: Payer: Self-pay

## 2019-03-12 ENCOUNTER — Ambulatory Visit (INDEPENDENT_AMBULATORY_CARE_PROVIDER_SITE_OTHER): Payer: Medicare HMO | Admitting: Sports Medicine

## 2019-03-12 VITALS — Temp 97.1°F

## 2019-03-12 DIAGNOSIS — Z9889 Other specified postprocedural states: Secondary | ICD-10-CM

## 2019-03-12 DIAGNOSIS — L02611 Cutaneous abscess of right foot: Secondary | ICD-10-CM

## 2019-03-12 DIAGNOSIS — E08621 Diabetes mellitus due to underlying condition with foot ulcer: Secondary | ICD-10-CM

## 2019-03-12 DIAGNOSIS — L97414 Non-pressure chronic ulcer of right heel and midfoot with necrosis of bone: Secondary | ICD-10-CM

## 2019-03-12 DIAGNOSIS — M71071 Abscess of bursa, right ankle and foot: Secondary | ICD-10-CM

## 2019-03-12 DIAGNOSIS — L03031 Cellulitis of right toe: Secondary | ICD-10-CM

## 2019-03-12 MED ORDER — SULFAMETHOXAZOLE-TRIMETHOPRIM 800-160 MG PO TABS: 1.0000 | ORAL_TABLET | Freq: Two times a day (BID) | ORAL | 0 refills | Status: DC

## 2019-03-12 NOTE — Progress Notes (Signed)
Subjective: Harold Rogers is a 84 y.o. male patient seen in office for evaluation of ulceration of the right foot. Patient has a history of diabetes and is status post right foot incision and drainage secondary to abscess performed by Dr. March Rummage on 02/11/2019.   Patient has home nurse changing the dressing using silver packing and reports that the nurse came to his house on yesterday and change the dressing and his foot was okay but this morning he woke up with extreme pain warmth redness and swelling.  Patient only admits to seeing on yesterday's bloody drainage when the nurse change it. Denies nausea/fever/vomiting/chills/night sweats/shortness of breath. Patient has no other pedal complaints at this time.  Patient Active Problem List   Diagnosis Date Noted  . Cerebrovascular accident (CVA) ([place]) 04/15/2018  . Hyperlipidemia 03/22/2018  . Aftercare following surgery 02/14/2018  . Paronychia of fourth toe of right foot 01/31/2018  . Acid reflux 09/24/2017  . Hypertension 09/24/2017  . Prominent metatarsal head, right 09/19/2017  . Primary osteoarthritis of right knee 07/12/2016  . Hammer toes, bilateral 07/06/2016  . Pre-ulcerative corn or callous 07/06/2016   Current Outpatient Medications on File Prior to Visit  Medication Sig Dispense Refill  . amoxicillin-clavulanate (AUGMENTIN) 875-125 MG tablet Take 1 tablet by mouth 2 (two) times daily.    . famotidine (PEPCID) 40 MG tablet     . JARDIANCE 10 MG TABS tablet Take 10 mg by mouth daily.    Marland Kitchen lisinopril (ZESTRIL) 2.5 MG tablet     . mupirocin ointment (BACTROBAN) 2 % APPLY CREAM TO ULCER THREE TIMES A DAILY FOR TEN DAYS    . rosuvastatin (CRESTOR) 40 MG tablet      No current facility-administered medications on file prior to visit.    Not on File  No results found for this or any previous visit (from the past 2160 hour(s)).  Objective: There were no vitals filed for this visit.  General: Patient is awake, alert, oriented x 3 and  in no acute distress.  Dermatology: Skin is warm and dry bilateral with a full thickness ulceration present plantar right forefoot. Ulceration measures 0.8 x 0.4 probes to soft tissue in range and tunnels at the 12 o'clock position with a communicating wound that is pinpoint in nature. There is a  Mildly macerated border with a granular base.  There is no malodor, bloody active drainage, focal erythema, focal edema.      Vascular: Dorsalis Pedis pulse = 1/4 Bilateral,  Posterior Tibial pulse = 1/4 Bilateral,  Capillary Fill Time < 5 seconds  Neurologic: Gross sensation present via light touch.  Musculosketal: Digital deformity.  Pain to palpation to right forefoot at area of ulceration.  Xrays, Right foot: Diffuse arthritis.  No bony destruction suggestive of osteomyelitis at area of ulcer defect. No gas in soft tissues.   No results for input(s): GRAMSTAIN, LABORGA in the last 8760 hours.  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 -Xrays reviewed -Cleansed the wound and deep wound culture culture was obtained -Applied packing and dry sterile  dressing and instructed patient to continue with home nursing 3 times per week applying packing to the area -Restarted patient on Bactrim antibiotic and advised patient that once the culture results are available Dr. March Rummage will discuss with him if there is a need to change this antibiotic - Advised patient to go to the ER or return to office if the wound worsens or if constitutional symptoms are present. -Continue with postop shoe with offloading padding as I placed this visit -Patient to return to office on Tuesday for follow-up evaluation with Dr. March Rummage. Landis Martins, DPM

## 2019-03-17 ENCOUNTER — Ambulatory Visit (INDEPENDENT_AMBULATORY_CARE_PROVIDER_SITE_OTHER): Payer: TRICARE For Life (TFL) | Admitting: Podiatry

## 2019-03-17 ENCOUNTER — Encounter: Payer: Self-pay | Admitting: Podiatry

## 2019-03-17 ENCOUNTER — Other Ambulatory Visit: Payer: Self-pay

## 2019-03-17 VITALS — Temp 97.3°F | Resp 16

## 2019-03-17 DIAGNOSIS — L97414 Non-pressure chronic ulcer of right heel and midfoot with necrosis of bone: Secondary | ICD-10-CM

## 2019-03-17 DIAGNOSIS — I951 Orthostatic hypotension: Secondary | ICD-10-CM | POA: Diagnosis not present

## 2019-03-17 DIAGNOSIS — Z9889 Other specified postprocedural states: Secondary | ICD-10-CM

## 2019-03-17 DIAGNOSIS — M71071 Abscess of bursa, right ankle and foot: Secondary | ICD-10-CM | POA: Diagnosis not present

## 2019-03-17 DIAGNOSIS — E7439 Other disorders of intestinal carbohydrate absorption: Secondary | ICD-10-CM | POA: Diagnosis not present

## 2019-03-17 DIAGNOSIS — E119 Type 2 diabetes mellitus without complications: Secondary | ICD-10-CM | POA: Diagnosis not present

## 2019-03-17 DIAGNOSIS — L03031 Cellulitis of right toe: Secondary | ICD-10-CM | POA: Diagnosis not present

## 2019-03-17 DIAGNOSIS — E08621 Diabetes mellitus due to underlying condition with foot ulcer: Secondary | ICD-10-CM | POA: Diagnosis not present

## 2019-03-17 DIAGNOSIS — L02611 Cutaneous abscess of right foot: Secondary | ICD-10-CM | POA: Diagnosis not present

## 2019-03-17 DIAGNOSIS — Z6826 Body mass index (BMI) 26.0-26.9, adult: Secondary | ICD-10-CM | POA: Diagnosis not present

## 2019-03-17 DIAGNOSIS — M1990 Unspecified osteoarthritis, unspecified site: Secondary | ICD-10-CM | POA: Diagnosis not present

## 2019-03-17 NOTE — Progress Notes (Signed)
  Subjective:  Patient ID: Harold Rogers, male    DOB: 01-20-1935,  MRN: [id number]  Chief Complaint  Patient presents with  . Wound Check    F/U Rt foot wound check Pt. states," foot seems to be doing alright. Nurse thinks abx is helping in the healing process." Tx: bactrim, sx shoe, packing dressing -w/ less bloody drainage/redness and swelling   . Results    Reveiw cx results   DOS: 02/11/2019 Procedure: Incision and Drainage R Foot abscess, 2nd metatarsal resection, 3rd MPJ Arthrotomy  84 y.o. male returns for post-op check.  History as above.  States that the antibiotic appears to be helping that was prescribed at last visit.  Taking the Bactrim as prescribed.  Review of Systems: Negative except as noted in the HPI. Denies N/V/F/Ch.  No past medical history on file.  Current Outpatient Medications:  .  amoxicillin-clavulanate (AUGMENTIN) 875-125 MG tablet, Take 1 tablet by mouth 2 (two) times daily., Disp: , Rfl:  .  famotidine (PEPCID) 40 MG tablet, , Disp: , Rfl:  .  JARDIANCE 10 MG TABS tablet, Take 10 mg by mouth daily., Disp: , Rfl:  .  lisinopril (ZESTRIL) 2.5 MG tablet, , Disp: , Rfl:  .  mupirocin ointment (BACTROBAN) 2 %, APPLY CREAM TO ULCER THREE TIMES A DAILY FOR TEN DAYS, Disp: , Rfl:  .  rosuvastatin (CRESTOR) 40 MG tablet, , Disp: , Rfl:  .  sulfamethoxazole-trimethoprim (BACTRIM DS) 800-160 MG tablet, Take 1 tablet by mouth 2 (two) times daily. for 14 days, Disp: 28 tablet, Rfl: 0  Social History   Tobacco Use  Smoking Status Never Smoker  Smokeless Tobacco Never Used    Not on File Objective:   Vitals:   03/17/19 1059  Resp: 16  Temp: (!) 97.3 F (36.3 C)   There is no height or weight on file to calculate BMI. Constitutional Well developed. Well nourished.  Vascular Foot warm and well perfused. Capillary refill normal to all digits.   Neurologic Normal speech. Oriented to person, place, and time. Epicritic sensation to light touch grossly present  bilaterally.  Dermatologic  wound appears to be healing well with central skin bridging and 2 small areas of opening with deep probing but no probe to bone.  No excessive warmth erythema none today.  Serous drainage no purulence no fluctuance or crepitus.  Orthopedic: No tenderness to palpation noted about the surgical site. 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 Post-operative state   3. Abscess of bursa of right foot   4. Cellulitis and abscess of toe of right foot    Plan:  Patient was evaluated and treated and all questions answered.  S/p foot surgery right -Warmth and erythema resolved today -Repeat culture taken today. -WB Status: WBAt in surgical shoe -Continue Iodosorb packing with home health care -Rx written for diabetic shoes to be filled at [facility].  Will fax to [place] and informed patient to make appointment for  Return in about 1 week (around 03/24/2019).

## 2019-03-20 LAB — WOUND CULTURE: Organism ID, Bacteria: NONE SEEN

## 2019-03-24 ENCOUNTER — Ambulatory Visit (INDEPENDENT_AMBULATORY_CARE_PROVIDER_SITE_OTHER): Payer: TRICARE For Life (TFL) | Admitting: Podiatry

## 2019-03-24 ENCOUNTER — Encounter: Payer: Self-pay | Admitting: Podiatry

## 2019-03-24 ENCOUNTER — Other Ambulatory Visit: Payer: Self-pay

## 2019-03-24 VITALS — Temp 97.8°F | Resp 16

## 2019-03-24 DIAGNOSIS — Z9889 Other specified postprocedural states: Secondary | ICD-10-CM

## 2019-03-24 DIAGNOSIS — L97414 Non-pressure chronic ulcer of right heel and midfoot with necrosis of bone: Secondary | ICD-10-CM

## 2019-03-24 DIAGNOSIS — E08621 Diabetes mellitus due to underlying condition with foot ulcer: Secondary | ICD-10-CM

## 2019-03-24 NOTE — Progress Notes (Signed)
  Subjective:  Patient ID: Harold Rogers, male    DOB: 1935/02/03,  MRN: [id number]  Chief Complaint  Patient presents with  . Wound Check    F/U Rt wound check Pt. states," seems to be doing better, getting smaller." tx: packing, dresssing by [facility] and sx shoe -w/ less draiange and redness -w/ same amount of swelling   DOS: 02/11/2019 Procedure: Incision and Drainage R Foot abscess, 2nd metatarsal resection, 3rd MPJ Arthrotomy  84 y.o. male returns for post-op check.  History as above.  Thinks the wound is doing well. States his [facility] nurse has had to pack the wound less.  Review of Systems: Negative except as noted in the HPI. Denies N/V/F/Ch.  No past medical history on file.  Current Outpatient Medications:  .  amoxicillin-clavulanate (AUGMENTIN) 875-125 MG tablet, Take 1 tablet by mouth 2 (two) times daily., Disp: , Rfl:  .  famotidine (PEPCID) 40 MG tablet, , Disp: , Rfl:  .  JARDIANCE 10 MG TABS tablet, Take 10 mg by mouth daily., Disp: , Rfl:  .  lisinopril (ZESTRIL) 2.5 MG tablet, , Disp: , Rfl:  .  mupirocin ointment (BACTROBAN) 2 %, APPLY CREAM TO ULCER THREE TIMES A DAILY FOR TEN DAYS, Disp: , Rfl:  .  rosuvastatin (CRESTOR) 40 MG tablet, , Disp: , Rfl:  .  sulfamethoxazole-trimethoprim (BACTRIM DS) 800-160 MG tablet, Take 1 tablet by mouth 2 (two) times daily. for 14 days, Disp: 28 tablet, Rfl: 0  Social History   Tobacco Use  Smoking Status Never Smoker  Smokeless Tobacco Never Used    Not on File Objective:   Vitals:   03/24/19 1106  Resp: 16  Temp: 97.8 F (36.6 C)   There is no height or weight on file to calculate BMI. Constitutional Well developed. Well nourished.  Vascular Foot warm and well perfused. Capillary refill normal to all digits.   Neurologic Normal speech. Oriented to person, place, and time. Epicritic sensation to light touch grossly present bilaterally.  Dermatologic Wound healing well probes only 0.5 cm deep, central skin bridge with two  small sinuses. No active drainage. No warmth or erythema. No signs of acute infection.  Orthopedic: No tenderness to palpation noted about the surgical site. 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 Plan:  Patient was evaluated and treated and all questions answered.  S/p foot surgery right -Warmth and erythema have not recurred.  -Culture taken at last visit without organisms. -WB Status: WBAt in surgical shoe -Continue packing with [facility] -Wound packed with iodoform packing -Advised to follow up with [facility] for DM shoes.  Return in about 1 week (around 03/31/2019) for Post-op, Nail Check.

## 2019-03-31 ENCOUNTER — Encounter: Payer: Self-pay | Admitting: Podiatry

## 2019-03-31 ENCOUNTER — Ambulatory Visit (INDEPENDENT_AMBULATORY_CARE_PROVIDER_SITE_OTHER): Payer: TRICARE For Life (TFL) | Admitting: Podiatry

## 2019-03-31 ENCOUNTER — Other Ambulatory Visit: Payer: Self-pay

## 2019-03-31 VITALS — Temp 98.0°F | Resp 16

## 2019-03-31 DIAGNOSIS — L97414 Non-pressure chronic ulcer of right heel and midfoot with necrosis of bone: Secondary | ICD-10-CM

## 2019-03-31 DIAGNOSIS — Z9889 Other specified postprocedural states: Secondary | ICD-10-CM

## 2019-03-31 DIAGNOSIS — E08621 Diabetes mellitus due to underlying condition with foot ulcer: Secondary | ICD-10-CM

## 2019-03-31 NOTE — Progress Notes (Signed)
  Subjective:  Patient ID: Harold Rogers, male    DOB: Jul 18, 1935,  MRN: [id number]  Chief Complaint  Patient presents with  . Wound Check    F/U Rt wound check Pt. states," seem sfine to me, it's doing better. Looks like it's closing up." tx: sx shoe, and packing -pt denies pain/redness/swelling/driange   DOS: 02/11/2019 Procedure: Incision and Drainage R Foot abscess, 2nd metatarsal resection, 3rd MPJ Arthrotomy  84 y.o. male returns for post-op check.  History as above. Did not get measured for diabetic shoes.  Review of Systems: Negative except as noted in the HPI. Denies N/V/F/Ch.  No past medical history on file.  Current Outpatient Medications:  .  amoxicillin-clavulanate (AUGMENTIN) 875-125 MG tablet, Take 1 tablet by mouth 2 (two) times daily., Disp: , Rfl:  .  famotidine (PEPCID) 40 MG tablet, , Disp: , Rfl:  .  JARDIANCE 10 MG TABS tablet, Take 10 mg by mouth daily., Disp: , Rfl:  .  lisinopril (ZESTRIL) 2.5 MG tablet, , Disp: , Rfl:  .  mupirocin ointment (BACTROBAN) 2 %, APPLY CREAM TO ULCER THREE TIMES A DAILY FOR TEN DAYS, Disp: , Rfl:  .  rosuvastatin (CRESTOR) 40 MG tablet, , Disp: , Rfl:  .  sulfamethoxazole-trimethoprim (BACTRIM DS) 800-160 MG tablet, Take 1 tablet by mouth 2 (two) times daily. for 14 days, Disp: 28 tablet, Rfl: 0  Social History   Tobacco Use  Smoking Status Never Smoker  Smokeless Tobacco Never Used    Not on File Objective:   Vitals:   03/31/19 1030  Resp: 16  Temp: 98 F (36.7 C)   There is no height or weight on file to calculate BMI. Constitutional Well developed. Well nourished.  Vascular Foot warm and well perfused. Capillary refill normal to all digits.   Neurologic Normal speech. Oriented to person, place, and time. Epicritic sensation to light touch grossly present bilaterally.  Dermatologic Wound healing well probes only 0.3 cm deep, central skin bridge with two small sinuses. No active drainage. No warmth or erythema. No  signs of acute infection.  Orthopedic: No tenderness to palpation noted about the surgical site. 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 Post-operative state   2. 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  S/p foot surgery right -Warmth and erythema have not recurred.  -WB Status: WBAt in surgical shoe -Continue packing with [facility] -Wound again packed with iodoform packing -Advised to follow up with [facility] for DM shoes. Will plan to transition from surgical shoe to diabetic shoe once healed.  No follow-ups on file.

## 2019-04-01 ENCOUNTER — Telehealth: Payer: Self-pay | Admitting: Podiatry

## 2019-04-01 NOTE — Telephone Encounter (Signed)
Pt has a couple of questions about diabetic shoes/ pt had to wait to the swelling went down. Pt called [facility] and they stated they did not have a prescription/ paper that is needed. Pt is about ready to come out of his boot.

## 2019-04-01 NOTE — Telephone Encounter (Signed)
Patient has called 4 times the last 2 days the issue is [facility] can not get him in until the middle of July I have refaxed to Rx and office notes again at Dr Price's request.  I have told him there is nothing we can do to make [facility] get him in sooner.

## 2019-04-02 DIAGNOSIS — E11621 Type 2 diabetes mellitus with foot ulcer: Secondary | ICD-10-CM | POA: Diagnosis not present

## 2019-04-02 DIAGNOSIS — L97509 Non-pressure chronic ulcer of other part of unspecified foot with unspecified severity: Secondary | ICD-10-CM | POA: Diagnosis not present

## 2019-04-02 DIAGNOSIS — Z6826 Body mass index (BMI) 26.0-26.9, adult: Secondary | ICD-10-CM | POA: Diagnosis not present

## 2019-04-02 DIAGNOSIS — M1990 Unspecified osteoarthritis, unspecified site: Secondary | ICD-10-CM | POA: Diagnosis not present

## 2019-04-06 ENCOUNTER — Telehealth: Payer: Self-pay

## 2019-04-07 ENCOUNTER — Ambulatory Visit (INDEPENDENT_AMBULATORY_CARE_PROVIDER_SITE_OTHER): Payer: TRICARE For Life (TFL) | Admitting: Podiatry

## 2019-04-07 ENCOUNTER — Encounter: Payer: Self-pay | Admitting: Podiatry

## 2019-04-07 ENCOUNTER — Other Ambulatory Visit: Payer: Self-pay

## 2019-04-07 VITALS — Temp 98.0°F | Resp 16

## 2019-04-07 DIAGNOSIS — L97414 Non-pressure chronic ulcer of right heel and midfoot with necrosis of bone: Secondary | ICD-10-CM

## 2019-04-07 DIAGNOSIS — Z9889 Other specified postprocedural states: Secondary | ICD-10-CM

## 2019-04-07 DIAGNOSIS — E08621 Diabetes mellitus due to underlying condition with foot ulcer: Secondary | ICD-10-CM

## 2019-04-07 NOTE — Progress Notes (Signed)
  Subjective:  Patient ID: Harold Rogers, male    DOB: October 05, 1935,  MRN: [id number]  Chief Complaint  Patient presents with  . Wound Check    F/U Rt wound check pt. states," Nurse says it looks good, but it would be 60 days and I'm starting to get fe up; w/ little pain; 3/10." tx: sx shoe, packing and dressing change by [facility] -pt deneis draiange/redness/swelling  . Diabetes    FBS: "don't check" A1C: 6.4   DOS: 02/11/2019 Procedure: Incision and Drainage R Foot abscess, 2nd metatarsal resection, 3rd MPJ Arthrotomy  84 y.o. male returns for post-op check.  History as above.   Review of Systems: Negative except as noted in the HPI. Denies N/V/F/Ch.  No past medical history on file.  Current Outpatient Medications:  .  amoxicillin-clavulanate (AUGMENTIN) 875-125 MG tablet, Take 1 tablet by mouth 2 (two) times daily., Disp: , Rfl:  .  famotidine (PEPCID) 40 MG tablet, , Disp: , Rfl:  .  JARDIANCE 10 MG TABS tablet, Take 10 mg by mouth daily., Disp: , Rfl:  .  lisinopril (ZESTRIL) 2.5 MG tablet, , Disp: , Rfl:  .  mupirocin ointment (BACTROBAN) 2 %, APPLY CREAM TO ULCER THREE TIMES A DAILY FOR TEN DAYS, Disp: , Rfl:  .  rosuvastatin (CRESTOR) 40 MG tablet, , Disp: , Rfl:  .  sulfamethoxazole-trimethoprim (BACTRIM DS) 800-160 MG tablet, Take 1 tablet by mouth 2 (two) times daily. for 14 days, Disp: 28 tablet, Rfl: 0  Social History   Tobacco Use  Smoking Status Never Smoker  Smokeless Tobacco Never Used    Not on File Objective:   Vitals:   04/07/19 1006  Resp: 16  Temp: 98 F (36.7 C)   There is no height or weight on file to calculate BMI. Constitutional Well developed. Well nourished.  Vascular Foot warm and well perfused. Capillary refill normal to all digits.   Neurologic Normal speech. Oriented to person, place, and time. Epicritic sensation to light touch grossly present bilaterally.  Dermatologic Wound healing well probes only about 0.2 cm deep with small central skin  bridge with slight surrounding hyperkeratosis.  Orthopedic: No tenderness to palpation noted about the surgical site. 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 Post-operative state   2. 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  S/p foot surgery right -Wound improving.  Minimal depth.  Discussed that the wound would likely have to be back at further.  Discussed that likely we can suture the wound closed if needed -At this point he has an appointment for his diabetic shoes.  We will transition him out of his surgical shoe into diabetic shoes once those are fabricated. -Continue packing for another week or so -Discussed delayed healing surgical wounds especially in diabetics and that being 2 months out is not really concerning at this time. -Follow-up in 1 week  No follow-ups on file.

## 2019-04-07 NOTE — Telephone Encounter (Signed)
Patient was seen in office 04/07/2019

## 2019-04-08 DIAGNOSIS — Z7982 Long term (current) use of aspirin: Secondary | ICD-10-CM | POA: Diagnosis not present

## 2019-04-08 DIAGNOSIS — Z4800 Encounter for change or removal of nonsurgical wound dressing: Secondary | ICD-10-CM | POA: Diagnosis not present

## 2019-04-08 DIAGNOSIS — E1151 Type 2 diabetes mellitus with diabetic peripheral angiopathy without gangrene: Secondary | ICD-10-CM | POA: Diagnosis not present

## 2019-04-08 DIAGNOSIS — E785 Hyperlipidemia, unspecified: Secondary | ICD-10-CM | POA: Diagnosis not present

## 2019-04-08 DIAGNOSIS — N179 Acute kidney failure, unspecified: Secondary | ICD-10-CM | POA: Diagnosis not present

## 2019-04-08 DIAGNOSIS — L02611 Cutaneous abscess of right foot: Secondary | ICD-10-CM | POA: Diagnosis not present

## 2019-04-08 DIAGNOSIS — M86171 Other acute osteomyelitis, right ankle and foot: Secondary | ICD-10-CM | POA: Diagnosis not present

## 2019-04-08 DIAGNOSIS — E1169 Type 2 diabetes mellitus with other specified complication: Secondary | ICD-10-CM | POA: Diagnosis not present

## 2019-04-09 DIAGNOSIS — Z7982 Long term (current) use of aspirin: Secondary | ICD-10-CM | POA: Diagnosis not present

## 2019-04-09 DIAGNOSIS — Z4800 Encounter for change or removal of nonsurgical wound dressing: Secondary | ICD-10-CM | POA: Diagnosis not present

## 2019-04-09 DIAGNOSIS — L02611 Cutaneous abscess of right foot: Secondary | ICD-10-CM | POA: Diagnosis not present

## 2019-04-09 DIAGNOSIS — M86171 Other acute osteomyelitis, right ankle and foot: Secondary | ICD-10-CM | POA: Diagnosis not present

## 2019-04-09 DIAGNOSIS — E785 Hyperlipidemia, unspecified: Secondary | ICD-10-CM | POA: Diagnosis not present

## 2019-04-09 DIAGNOSIS — N179 Acute kidney failure, unspecified: Secondary | ICD-10-CM | POA: Diagnosis not present

## 2019-04-09 DIAGNOSIS — E1151 Type 2 diabetes mellitus with diabetic peripheral angiopathy without gangrene: Secondary | ICD-10-CM | POA: Diagnosis not present

## 2019-04-09 DIAGNOSIS — E1169 Type 2 diabetes mellitus with other specified complication: Secondary | ICD-10-CM | POA: Diagnosis not present

## 2019-04-13 DIAGNOSIS — E1169 Type 2 diabetes mellitus with other specified complication: Secondary | ICD-10-CM | POA: Diagnosis not present

## 2019-04-13 DIAGNOSIS — N179 Acute kidney failure, unspecified: Secondary | ICD-10-CM | POA: Diagnosis not present

## 2019-04-13 DIAGNOSIS — M86171 Other acute osteomyelitis, right ankle and foot: Secondary | ICD-10-CM | POA: Diagnosis not present

## 2019-04-13 DIAGNOSIS — L02611 Cutaneous abscess of right foot: Secondary | ICD-10-CM | POA: Diagnosis not present

## 2019-04-13 DIAGNOSIS — Z4800 Encounter for change or removal of nonsurgical wound dressing: Secondary | ICD-10-CM | POA: Diagnosis not present

## 2019-04-13 DIAGNOSIS — E785 Hyperlipidemia, unspecified: Secondary | ICD-10-CM | POA: Diagnosis not present

## 2019-04-13 DIAGNOSIS — E1151 Type 2 diabetes mellitus with diabetic peripheral angiopathy without gangrene: Secondary | ICD-10-CM | POA: Diagnosis not present

## 2019-04-13 DIAGNOSIS — Z7982 Long term (current) use of aspirin: Secondary | ICD-10-CM | POA: Diagnosis not present

## 2019-04-14 ENCOUNTER — Ambulatory Visit (INDEPENDENT_AMBULATORY_CARE_PROVIDER_SITE_OTHER): Payer: TRICARE For Life (TFL) | Admitting: Podiatry

## 2019-04-14 ENCOUNTER — Encounter: Payer: Self-pay | Admitting: Podiatry

## 2019-04-14 ENCOUNTER — Other Ambulatory Visit: Payer: Self-pay

## 2019-04-14 VITALS — Temp 98.2°F

## 2019-04-14 DIAGNOSIS — L97414 Non-pressure chronic ulcer of right heel and midfoot with necrosis of bone: Secondary | ICD-10-CM

## 2019-04-14 DIAGNOSIS — E08621 Diabetes mellitus due to underlying condition with foot ulcer: Secondary | ICD-10-CM

## 2019-04-14 DIAGNOSIS — Z9889 Other specified postprocedural states: Secondary | ICD-10-CM

## 2019-04-15 ENCOUNTER — Telehealth: Payer: Self-pay | Admitting: *Deleted

## 2019-04-15 DIAGNOSIS — Z7982 Long term (current) use of aspirin: Secondary | ICD-10-CM | POA: Diagnosis not present

## 2019-04-15 DIAGNOSIS — L02611 Cutaneous abscess of right foot: Secondary | ICD-10-CM | POA: Diagnosis not present

## 2019-04-15 DIAGNOSIS — E785 Hyperlipidemia, unspecified: Secondary | ICD-10-CM | POA: Diagnosis not present

## 2019-04-15 DIAGNOSIS — E1151 Type 2 diabetes mellitus with diabetic peripheral angiopathy without gangrene: Secondary | ICD-10-CM | POA: Diagnosis not present

## 2019-04-15 DIAGNOSIS — E1169 Type 2 diabetes mellitus with other specified complication: Secondary | ICD-10-CM | POA: Diagnosis not present

## 2019-04-15 DIAGNOSIS — M86171 Other acute osteomyelitis, right ankle and foot: Secondary | ICD-10-CM | POA: Diagnosis not present

## 2019-04-15 DIAGNOSIS — N179 Acute kidney failure, unspecified: Secondary | ICD-10-CM | POA: Diagnosis not present

## 2019-04-15 DIAGNOSIS — Z4800 Encounter for change or removal of nonsurgical wound dressing: Secondary | ICD-10-CM | POA: Diagnosis not present

## 2019-04-15 MED ORDER — SILVER SULFADIAZINE 1 % EX CREA: 1.0000 "application " | TOPICAL_CREAM | Freq: Every day | CUTANEOUS | 1 refills | Status: DC

## 2019-04-15 NOTE — Telephone Encounter (Signed)
Dr. March Rummage 04/15/2019 4:00pm orders called to [place].

## 2019-04-15 NOTE — Telephone Encounter (Signed)
Continue wound care - silvadene to wound twice weekly.   Ok to use [employer] as of next week when he wears his normal shoe with the offloading insert we adjusted for him

## 2019-04-15 NOTE — Telephone Encounter (Signed)
[facility] - [facility] asked if Dr. March Rummage would like to continue wound care, and pt would like to know if he could shower and if he could use a riding Conservation officer, nature.

## 2019-04-15 NOTE — Telephone Encounter (Signed)
I asked pt for the pharmacy and [facility] [place]. I sent the [facility]adene to [id number].

## 2019-04-15 NOTE — Telephone Encounter (Signed)
[facility] states pt doesn't have silvadene cream. I told pt I would order the silvadene after I got pt's pharmacy.

## 2019-04-17 DIAGNOSIS — Z4800 Encounter for change or removal of nonsurgical wound dressing: Secondary | ICD-10-CM | POA: Diagnosis not present

## 2019-04-17 DIAGNOSIS — E1169 Type 2 diabetes mellitus with other specified complication: Secondary | ICD-10-CM | POA: Diagnosis not present

## 2019-04-17 DIAGNOSIS — M86171 Other acute osteomyelitis, right ankle and foot: Secondary | ICD-10-CM | POA: Diagnosis not present

## 2019-04-17 DIAGNOSIS — Z7982 Long term (current) use of aspirin: Secondary | ICD-10-CM | POA: Diagnosis not present

## 2019-04-17 DIAGNOSIS — E785 Hyperlipidemia, unspecified: Secondary | ICD-10-CM | POA: Diagnosis not present

## 2019-04-17 DIAGNOSIS — L02611 Cutaneous abscess of right foot: Secondary | ICD-10-CM | POA: Diagnosis not present

## 2019-04-17 DIAGNOSIS — N179 Acute kidney failure, unspecified: Secondary | ICD-10-CM | POA: Diagnosis not present

## 2019-04-17 DIAGNOSIS — E1151 Type 2 diabetes mellitus with diabetic peripheral angiopathy without gangrene: Secondary | ICD-10-CM | POA: Diagnosis not present

## 2019-04-21 ENCOUNTER — Ambulatory Visit (INDEPENDENT_AMBULATORY_CARE_PROVIDER_SITE_OTHER): Payer: Medicare HMO | Admitting: Podiatry

## 2019-04-21 ENCOUNTER — Encounter: Payer: Self-pay | Admitting: Podiatry

## 2019-04-21 ENCOUNTER — Other Ambulatory Visit: Payer: Self-pay

## 2019-04-21 VITALS — Temp 98.5°F | Resp 16

## 2019-04-21 DIAGNOSIS — M2011 Hallux valgus (acquired), right foot: Secondary | ICD-10-CM | POA: Diagnosis not present

## 2019-04-21 DIAGNOSIS — Z9889 Other specified postprocedural states: Secondary | ICD-10-CM

## 2019-04-21 DIAGNOSIS — E08621 Diabetes mellitus due to underlying condition with foot ulcer: Secondary | ICD-10-CM

## 2019-04-21 DIAGNOSIS — M2041 Other hammer toe(s) (acquired), right foot: Secondary | ICD-10-CM

## 2019-04-21 NOTE — Progress Notes (Signed)
  Subjective:  Patient ID: Harold Rogers, male    DOB: [birth year],  MRN: [id number]  Chief Complaint  Patient presents with  . Wound Check    Rt wound check pt. states," doing and looking much better. I think the inserts are helping a lot; 1-2/10." Tx: silvadene and bandaid -pt deneis drainage/rendess -w/ less swelling   . Diabetes    FBS: "don't check A1C: 6.4   DOS: 02/11/2019 Procedure: Incision and Drainage R Foot abscess, 2nd metatarsal resection, 3rd MPJ Arthrotomy  84 y.o. male returns for post-op check.  History as above. Thinks the wound is doing great.  Has questions today about use of hammertoe crest pad and bunion gel cushion for the painful bunion he has.  Review of Systems: Negative except as noted in the HPI. Denies N/V/F/Ch.  No past medical history on file.  Current Outpatient Medications:  .  amoxicillin-clavulanate (AUGMENTIN) 875-125 MG tablet, Take 1 tablet by mouth 2 (two) times daily., Disp: , Rfl:  .  famotidine (PEPCID) 40 MG tablet, , Disp: , Rfl:  .  JARDIANCE 10 MG TABS tablet, Take 10 mg by mouth daily., Disp: , Rfl:  .  lisinopril (ZESTRIL) 2.5 MG tablet, , Disp: , Rfl:  .  mupirocin ointment (BACTROBAN) 2 %, APPLY CREAM TO ULCER THREE TIMES A DAILY FOR TEN DAYS, Disp: , Rfl:  .  rosuvastatin (CRESTOR) 40 MG tablet, , Disp: , Rfl:  .  silver sulfADIAZINE (SILVADENE) 1 % cream, Apply 1 application topically daily., Disp: 50 g, Rfl: 1 .  sulfamethoxazole-trimethoprim (BACTRIM DS) 800-160 MG tablet, Take 1 tablet by mouth 2 (two) times daily. for 14 days, Disp: 28 tablet, Rfl: 0  Social History   Tobacco Use  Smoking Status Never Smoker  Smokeless Tobacco Never Used    Not on File Objective:   Vitals:   04/21/19 0954  Resp: 16  Temp: 98.5 F (36.9 C)   There is no height or weight on file to calculate BMI. Constitutional Well developed. Well nourished.  Vascular Foot warm and well perfused. Capillary refill normal to all digits.   Neurologic  Normal speech. Oriented to person, place, and time. Epicritic sensation to light touch grossly present bilaterally.  Dermatologic Wound healed with small hyperkeratoses submet 2/3, incision plantar 2nd MPJ essentially healed with slight rolled border.  Orthopedic: No tenderness to palpation noted about the surgical site. 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 Post-operative state   3. Hammertoe of right foot   4. Hallux valgus, right    Plan:  Patient was evaluated and treated and all questions answered.  S/p foot surgery right -Wound continues to improve. No open ulceration noted. Awaiting DM shoes. Continue offloading insert until DM shoes fabricated. -Follow-up in 1 month  Hallux Valgus, Hammertoe Right Foot -Discussed pressure reduction strategies on the medial eminence -Dispensed toe alignment splint, educated on use. Medically necessary to hold toes in better alignment. Advised he can use this while walking or at night, but not both.  No follow-ups on file.

## 2019-04-22 DIAGNOSIS — Z4800 Encounter for change or removal of nonsurgical wound dressing: Secondary | ICD-10-CM | POA: Diagnosis not present

## 2019-04-22 DIAGNOSIS — E1151 Type 2 diabetes mellitus with diabetic peripheral angiopathy without gangrene: Secondary | ICD-10-CM | POA: Diagnosis not present

## 2019-04-22 DIAGNOSIS — E785 Hyperlipidemia, unspecified: Secondary | ICD-10-CM | POA: Diagnosis not present

## 2019-04-22 DIAGNOSIS — L02611 Cutaneous abscess of right foot: Secondary | ICD-10-CM | POA: Diagnosis not present

## 2019-04-22 DIAGNOSIS — E1169 Type 2 diabetes mellitus with other specified complication: Secondary | ICD-10-CM | POA: Diagnosis not present

## 2019-04-22 DIAGNOSIS — Z7982 Long term (current) use of aspirin: Secondary | ICD-10-CM | POA: Diagnosis not present

## 2019-04-22 DIAGNOSIS — M86171 Other acute osteomyelitis, right ankle and foot: Secondary | ICD-10-CM | POA: Diagnosis not present

## 2019-04-22 DIAGNOSIS — N179 Acute kidney failure, unspecified: Secondary | ICD-10-CM | POA: Diagnosis not present

## 2019-04-28 ENCOUNTER — Encounter: Payer: TRICARE For Life (TFL) | Admitting: Podiatry

## 2019-05-03 NOTE — Progress Notes (Signed)
  Subjective:  Patient ID: Harold Rogers, male    DOB: Feb 13, 1935,  MRN: [id number]  Chief Complaint  Patient presents with  . Routine Post Op    the right foot looks good and there is not any draining and i think it is healing nicely   DOS: 02/11/2019 Procedure: Incision and Drainage R Foot abscess, 2nd metatarsal resection, 3rd MPJ Arthrotomy  84 y.o. male returns for post-op check.  History as above.   Review of Systems: Negative except as noted in the HPI. Denies N/V/F/Ch.  History reviewed. No pertinent past medical history.  Current Outpatient Medications:  .  amoxicillin-clavulanate (AUGMENTIN) 875-125 MG tablet, Take 1 tablet by mouth 2 (two) times daily., Disp: , Rfl:  .  famotidine (PEPCID) 40 MG tablet, , Disp: , Rfl:  .  JARDIANCE 10 MG TABS tablet, Take 10 mg by mouth daily., Disp: , Rfl:  .  lisinopril (ZESTRIL) 2.5 MG tablet, , Disp: , Rfl:  .  mupirocin ointment (BACTROBAN) 2 %, APPLY CREAM TO ULCER THREE TIMES A DAILY FOR TEN DAYS, Disp: , Rfl:  .  rosuvastatin (CRESTOR) 40 MG tablet, , Disp: , Rfl:  .  silver sulfADIAZINE (SILVADENE) 1 % cream, Apply 1 application topically daily., Disp: 50 g, Rfl: 1 .  sulfamethoxazole-trimethoprim (BACTRIM DS) 800-160 MG tablet, Take 1 tablet by mouth 2 (two) times daily. for 14 days, Disp: 28 tablet, Rfl: 0  Social History   Tobacco Use  Smoking Status Never Smoker  Smokeless Tobacco Never Used    Not on File Objective:   Vitals:   04/14/19 0951  Temp: 98.2 F (36.8 C)   There is no height or weight on file to calculate BMI. Constitutional Well developed. Well nourished.  Vascular Foot warm and well perfused. Capillary refill normal to all digits.   Neurologic Normal speech. Oriented to person, place, and time. Epicritic sensation to light touch grossly present bilaterally.  Dermatologic Wound healing well probes only about 0.2 cm deep with small central skin bridge slight rolled border  Orthopedic: No tenderness  to palpation noted about the surgical site. 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 Post-operative state   2. 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  S/p foot surgery right -Wound improving minimal depth continue gentle packing.  Wound gently debrided slight rolled border excised with tissue nipper -Patient's previous insert was offloaded with padding to accommodate for his deformities  Return in about 1 week (around 04/21/2019) for Post-op, Right.

## 2019-05-05 ENCOUNTER — Encounter: Payer: TRICARE For Life (TFL) | Admitting: Podiatry

## 2019-05-12 ENCOUNTER — Encounter: Payer: Self-pay | Admitting: Podiatry

## 2019-05-12 ENCOUNTER — Ambulatory Visit (INDEPENDENT_AMBULATORY_CARE_PROVIDER_SITE_OTHER): Payer: TRICARE For Life (TFL) | Admitting: Podiatry

## 2019-05-12 ENCOUNTER — Other Ambulatory Visit: Payer: Self-pay

## 2019-05-12 VITALS — Temp 98.3°F | Resp 16

## 2019-05-12 DIAGNOSIS — Z9889 Other specified postprocedural states: Secondary | ICD-10-CM

## 2019-05-12 DIAGNOSIS — E08621 Diabetes mellitus due to underlying condition with foot ulcer: Secondary | ICD-10-CM

## 2019-05-12 DIAGNOSIS — L97414 Non-pressure chronic ulcer of right heel and midfoot with necrosis of bone: Secondary | ICD-10-CM

## 2019-05-12 NOTE — Progress Notes (Signed)
  Subjective:  Patient ID: Harold Rogers, male    DOB: [birth year],  MRN: [id number]  Chief Complaint  Patient presents with  . Foot Ulcer    F/U Rt wound check Pt.s tates," it's doing good, it's all closed up." -pt states he has minor pain at incsion site but the th toe has been bothering him some; 5/10 Tx: none -w/ less swelling -pt dneis drainag/redness   DOS: 02/11/2019 Procedure: Incision and Drainage R Foot abscess, 2nd metatarsal resection, 3rd MPJ Arthrotomy  84 y.o. male returns for post-op check.  History as above.   Review of Systems: Negative except as noted in the HPI. Denies N/V/F/Ch.  No past medical history on file.  Current Outpatient Medications:  .  amoxicillin-clavulanate (AUGMENTIN) 875-125 MG tablet, Take 1 tablet by mouth 2 (two) times daily., Disp: , Rfl:  .  famotidine (PEPCID) 40 MG tablet, , Disp: , Rfl:  .  JARDIANCE 10 MG TABS tablet, Take 10 mg by mouth daily., Disp: , Rfl:  .  lisinopril (ZESTRIL) 2.5 MG tablet, , Disp: , Rfl:  .  mupirocin ointment (BACTROBAN) 2 %, APPLY CREAM TO ULCER THREE TIMES A DAILY FOR TEN DAYS, Disp: , Rfl:  .  rosuvastatin (CRESTOR) 40 MG tablet, , Disp: , Rfl:  .  silver sulfADIAZINE (SILVADENE) 1 % cream, Apply 1 application topically daily., Disp: 50 g, Rfl: 1 .  sulfamethoxazole-trimethoprim (BACTRIM DS) 800-160 MG tablet, Take 1 tablet by mouth 2 (two) times daily. for 14 days, Disp: 28 tablet, Rfl: 0  Social History   Tobacco Use  Smoking Status Never Smoker  Smokeless Tobacco Never Used    Not on File Objective:   Vitals:   05/12/19 1011  Resp: 16  Temp: 98.3 F (36.8 C)   There is no height or weight on file to calculate BMI. Constitutional Well developed. Well nourished.  Vascular Foot warm and well perfused. Capillary refill normal to all digits.   Neurologic Normal speech. Oriented to person, place, and time. Epicritic sensation to light touch grossly present bilaterally.  Dermatologic Wound healed  with small hyperkeratoses submet 2/3.  Orthopedic: No tenderness to palpation noted about the surgical site. 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 Post-operative state   2. 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  S/p foot surgery right -Doing well f/u in 6 weeks. Will get patient on schedule for [facility] services. -Adjusted patient's DM inserts to offload the area of surgery.  Hallux Valgus, Hammertoe Right Foot -Awaiting DM shoes.  Return in about 6 weeks (around 06/23/2019) for Diabetic Foot Care.

## 2019-05-21 DIAGNOSIS — L97414 Non-pressure chronic ulcer of right heel and midfoot with necrosis of bone: Secondary | ICD-10-CM | POA: Diagnosis not present

## 2019-05-21 DIAGNOSIS — E11621 Type 2 diabetes mellitus with foot ulcer: Secondary | ICD-10-CM | POA: Diagnosis not present

## 2019-05-21 DIAGNOSIS — E08621 Diabetes mellitus due to underlying condition with foot ulcer: Secondary | ICD-10-CM | POA: Diagnosis not present

## 2019-06-23 ENCOUNTER — Ambulatory Visit (INDEPENDENT_AMBULATORY_CARE_PROVIDER_SITE_OTHER): Payer: Medicare HMO | Admitting: Podiatry

## 2019-06-23 ENCOUNTER — Other Ambulatory Visit: Payer: Self-pay

## 2019-06-23 DIAGNOSIS — L84 Corns and callosities: Secondary | ICD-10-CM

## 2019-06-23 DIAGNOSIS — B351 Tinea unguium: Secondary | ICD-10-CM | POA: Diagnosis not present

## 2019-06-23 DIAGNOSIS — E1142 Type 2 diabetes mellitus with diabetic polyneuropathy: Secondary | ICD-10-CM

## 2019-06-23 DIAGNOSIS — E1169 Type 2 diabetes mellitus with other specified complication: Secondary | ICD-10-CM | POA: Diagnosis not present

## 2019-06-29 NOTE — Progress Notes (Signed)
  Subjective:  Patient ID: Harold Rogers, male    DOB: 19-May-1935,  MRN: [id number]  Chief Complaint  Patient presents with  . Foot Pain    pt is here for a f/u on foot pain, pt states that his foot is feeling a lot better since the last time he was here, pt is here for diabetic foot care   DOS: 02/11/2019 Procedure: Incision and Drainage R Foot abscess, 2nd metatarsal resection, 3rd MPJ Arthrotomy  84 y.o. male returns for post-op check.  History as above.   Review of Systems: Negative except as noted in the HPI. Denies N/V/F/Ch.  No past medical history on file.  Current Outpatient Medications:  .  amoxicillin-clavulanate (AUGMENTIN) 875-125 MG tablet, Take 1 tablet by mouth 2 (two) times daily., Disp: , Rfl:  .  famotidine (PEPCID) 40 MG tablet, , Disp: , Rfl:  .  JARDIANCE 10 MG TABS tablet, Take 10 mg by mouth daily., Disp: , Rfl:  .  lisinopril (ZESTRIL) 2.5 MG tablet, , Disp: , Rfl:  .  mupirocin ointment (BACTROBAN) 2 %, APPLY CREAM TO ULCER THREE TIMES A DAILY FOR TEN DAYS, Disp: , Rfl:  .  rosuvastatin (CRESTOR) 40 MG tablet, , Disp: , Rfl:  .  silver sulfADIAZINE (SILVADENE) 1 % cream, Apply 1 application topically daily., Disp: 50 g, Rfl: 1 .  sulfamethoxazole-trimethoprim (BACTRIM DS) 800-160 MG tablet, Take 1 tablet by mouth 2 (two) times daily. for 14 days, Disp: 28 tablet, Rfl: 0  Social History   Tobacco Use  Smoking Status Never Smoker  Smokeless Tobacco Never Used    No Known Allergies Objective:   There were no vitals filed for this visit. There is no height or weight on file to calculate BMI. Constitutional Well developed. Well nourished.  Vascular Foot warm and well perfused. Capillary refill normal to all digits.   Neurologic Normal speech. Oriented to person, place, and time. Epicritic sensation to light touch grossly present bilaterally.  Dermatologic Wound healed with small hyperkeratoses submet 2/3.  Orthopedic: No tenderness to palpation noted  about the surgical site. 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 Onychomycosis of multiple toenails with type 2 diabetes mellitus and peripheral neuropathy ([place])   2. Callus    Plan:  Patient was evaluated and treated and all questions answered.  Diabetes with DPN, Onychomycosis -Educated on diabetic footcare. Diabetic risk level 3 -Nails x10 debrided sharply and manually with large nail nipper and rotary burr -Callus right foot debrided.  Procedure: Nail Debridement Rationale: Patient meets criteria for routine foot care due to DPN Type of Debridement: manual, sharp debridement. Instrumentation: Nail nipper, rotary burr. Number of Nails: 10   Procedure: Paring of Lesion Rationale: painful hyperkeratotic lesion Type of Debridement: manual, sharp debridement. Instrumentation: 312 blade Number of Lesions: 1    No follow-ups on file.

## 2019-07-06 DIAGNOSIS — M1711 Unilateral primary osteoarthritis, right knee: Secondary | ICD-10-CM | POA: Diagnosis not present

## 2019-07-14 DIAGNOSIS — Z23 Encounter for immunization: Secondary | ICD-10-CM | POA: Diagnosis not present

## 2019-08-25 ENCOUNTER — Ambulatory Visit: Payer: TRICARE For Life (TFL) | Admitting: Podiatry

## 2019-08-27 ENCOUNTER — Ambulatory Visit (INDEPENDENT_AMBULATORY_CARE_PROVIDER_SITE_OTHER): Payer: Medicare HMO | Admitting: Podiatry

## 2019-08-27 ENCOUNTER — Other Ambulatory Visit: Payer: Self-pay

## 2019-08-27 VITALS — Temp 97.5°F

## 2019-08-27 DIAGNOSIS — B351 Tinea unguium: Secondary | ICD-10-CM | POA: Diagnosis not present

## 2019-08-27 DIAGNOSIS — L84 Corns and callosities: Secondary | ICD-10-CM

## 2019-08-27 DIAGNOSIS — E1169 Type 2 diabetes mellitus with other specified complication: Secondary | ICD-10-CM | POA: Diagnosis not present

## 2019-08-27 DIAGNOSIS — E119 Type 2 diabetes mellitus without complications: Secondary | ICD-10-CM

## 2019-08-27 DIAGNOSIS — E1142 Type 2 diabetes mellitus with diabetic polyneuropathy: Secondary | ICD-10-CM

## 2019-09-05 NOTE — Progress Notes (Signed)
Subjective: Harold Rogers is a 84 y.o. y.o. male who presents today for preventative diabetic foot care with cc of painful, discolored, thick toenails and painful callus/corn which interfere with daily activities. Pain is aggravated when wearing enclosed shoe gear and relieved with periodic professional debridement.  He has brought in several right foot diabetic inserts and would like them modified with offloading pads on today.   Angelina Sheriff, MD is his PCP.  Medications reviewed in chart.  No Known Allergies  Objective: Vitals:   08/27/19 1022  Temp: (!) 97.5 F (36.4 C)   Vascular Examination: Capillary refill time to digits immediate b/l.  Dorsalis pedis pulses 1/4 bilaterally.  Posterior tibial pulses 1/4 bilaterally.  Digital hair absent b/l.  Skin temperature gradient WNL b/l.  Dermatological Examination: Skin with normal turgor, texture and tone b/l.  Toenails 1-5 b/l discolored, thick, dystrophic with subungual debris and pain with palpation to nailbeds due to thickness of nails.  Hyperkeratotic lesion submet heads 2, 3 right foot. No erythema, no edema, no drainage, no flocculence noted.   Musculoskeletal: Muscle strength 5/5 to all LE muscle groups b/l.  Severe HAV with bunion deformity right foot.  Hammertoes lesser digits 2-4 right foot.  Overlapping hammertoe 2nd digit right foot.  Neurological: Sensation intact 5/5 b/l with 10 gram monofilament.  Assessment: 1. Painful onychomycosis toenails 1-5 b/l 2.  Callus submet heads 2, 3 right foot 3.  NIDDM  Plan: 1. Continue diabetic foot care principles. 2. Toenails 1-5 b/l were debrided in length and girth without iatrogenic bleeding. 3. Hyperkeratotic lesions submet heads 2, 3 right foot pared with sterile scalpel blade without incident. 4. Offloaded right foot diabetic inserts with custom felt padding. 5. Patient to report any pedal injuries to medical professional immediately. 6. Follow up  6 weeks with Dr. March Rummage. 7. Patient/POA to call should there be a concern in the interim.

## 2019-09-06 ENCOUNTER — Encounter: Payer: Self-pay | Admitting: Podiatry

## 2019-10-06 ENCOUNTER — Ambulatory Visit (INDEPENDENT_AMBULATORY_CARE_PROVIDER_SITE_OTHER): Payer: Medicare HMO | Admitting: Podiatry

## 2019-10-06 ENCOUNTER — Other Ambulatory Visit: Payer: Self-pay

## 2019-10-06 DIAGNOSIS — E119 Type 2 diabetes mellitus without complications: Secondary | ICD-10-CM

## 2019-10-06 DIAGNOSIS — M21961 Unspecified acquired deformity of right lower leg: Secondary | ICD-10-CM | POA: Diagnosis not present

## 2019-10-06 DIAGNOSIS — E1142 Type 2 diabetes mellitus with diabetic polyneuropathy: Secondary | ICD-10-CM | POA: Diagnosis not present

## 2019-10-06 DIAGNOSIS — E1169 Type 2 diabetes mellitus with other specified complication: Secondary | ICD-10-CM | POA: Diagnosis not present

## 2019-10-06 DIAGNOSIS — B351 Tinea unguium: Secondary | ICD-10-CM

## 2019-10-06 DIAGNOSIS — L84 Corns and callosities: Secondary | ICD-10-CM

## 2019-10-06 NOTE — Progress Notes (Signed)
  Subjective:  Patient ID: Harold Rogers, male    DOB: [birth year],  MRN: [id number]  Chief Complaint  Patient presents with  . debride    diabetic nail trimming  . Diabetes    FBS: don't check A1C; 6   DOS: 02/11/2019 Procedure: Incision and Drainage R Foot abscess, 2nd metatarsal resection, 3rd MPJ Arthrotomy  84 y.o. male returns for post-op check.  History as above.   Review of Systems: Negative except as noted in the HPI. Denies N/V/F/Ch.  No past medical history on file.  Current Outpatient Medications:  .  amoxicillin-clavulanate (AUGMENTIN) 875-125 MG tablet, Take 1 tablet by mouth 2 (two) times daily., Disp: , Rfl:  .  famotidine (PEPCID) 40 MG tablet, , Disp: , Rfl:  .  JARDIANCE 10 MG TABS tablet, Take 10 mg by mouth daily., Disp: , Rfl:  .  lisinopril (ZESTRIL) 2.5 MG tablet, , Disp: , Rfl:  .  mupirocin ointment (BACTROBAN) 2 %, APPLY CREAM TO ULCER THREE TIMES A DAILY FOR TEN DAYS, Disp: , Rfl:  .  rosuvastatin (CRESTOR) 40 MG tablet, , Disp: , Rfl:  .  silver sulfADIAZINE (SILVADENE) 1 % cream, Apply 1 application topically daily. (Patient not taking: Reported on 08/27/2019), Disp: 50 g, Rfl: 1 .  sulfamethoxazole-trimethoprim (BACTRIM DS) 800-160 MG tablet, Take 1 tablet by mouth 2 (two) times daily. for 14 days (Patient not taking: Reported on 08/27/2019), Disp: 28 tablet, Rfl: 0  Social History   Tobacco Use  Smoking Status Never Smoker  Smokeless Tobacco Never Used    No Known Allergies Objective:   There were no vitals filed for this visit. There is no height or weight on file to calculate BMI. Constitutional Well developed. Well nourished.  Vascular Foot warm and well perfused. Capillary refill normal to all digits.   Neurologic Normal speech. Oriented to person, place, and time. Epicritic sensation to light touch grossly present bilaterally.  Dermatologic Nails thickened, dystrophic Small hyperkeratoses submet 2/3.  Orthopedic: Severe HAV deformity  right foot hammertoe deformities second third fourth toes with crossover deformity second toe   Radiographs: None Assessment:   1. Onychomycosis of multiple toenails with type 2 diabetes mellitus and peripheral neuropathy ([facility])   2. Callus   3. Controlled type 2 diabetes mellitus without complication, without long-term current use of insulin ([place])    Plan:  Patient was evaluated and treated and all questions answered.  Diabetes with DPN, Onychomycosis -Educated on diabetic footcare. Diabetic risk level 3 -Nails x10 debrided sharply and manually with large nail nipper and rotary burr -Callus right foot debrided. -Offloaded 2nd MPJ of right foot insert.  Procedure: Nail Debridement Rationale: Patient meets criteria for routine foot care due to DPN Type of Debridement: manual, sharp debridement. Instrumentation: Nail nipper, rotary burr. Number of Nails: 10   Procedure: Paring of Lesion Rationale: painful hyperkeratotic lesion Type of Debridement: manual, sharp debridement. Instrumentation: 312 blade Number of Lesions: 2  No follow-ups on file.

## 2019-12-07 ENCOUNTER — Ambulatory Visit (INDEPENDENT_AMBULATORY_CARE_PROVIDER_SITE_OTHER): Payer: Medicare HMO | Admitting: Podiatry

## 2019-12-07 ENCOUNTER — Other Ambulatory Visit: Payer: Self-pay

## 2019-12-07 DIAGNOSIS — E1142 Type 2 diabetes mellitus with diabetic polyneuropathy: Secondary | ICD-10-CM

## 2019-12-07 DIAGNOSIS — L84 Corns and callosities: Secondary | ICD-10-CM

## 2019-12-07 NOTE — Progress Notes (Signed)
  Subjective:  Patient ID: Harold Rogers, male    DOB: 12-20-1934,  MRN: [id number]  No chief complaint on file.   85 y.o. male presents with the above complaint. History confirmed with patient.   Objective:  Physical Exam: warm, good capillary refill, nail exam normal nails without lesions, no trophic changes or ulcerative lesions. DP pulses palpable, PT pulses palpable and protective sensation absent Left Foot: normal exam, no swelling, tenderness, instability; ligaments intact, full range of motion of all ankle/foot joints  Right Foot: normal exam, no swelling, tenderness, instability; ligaments intact, full range of motion of all ankle/foot joints HPK submet 2,3   No images are attached to the encounter.  Assessment:   1. Callus   2. DM type 2 with diabetic peripheral neuropathy ([place])      Plan:  Patient was evaluated and treated and all questions answered.  Diabetes and DPN -Patient is diabetic with a qualifying condition for at risk foot care. Procedure: Paring of Lesion Rationale: painful hyperkeratotic lesion Type of Debridement: manual, sharp debridement. Instrumentation: 312 blade Number of Lesions: 2  Return in about 2 weeks (around 12/21/2019) for Diabetic foot care .

## 2019-12-08 ENCOUNTER — Ambulatory Visit: Payer: TRICARE For Life (TFL) | Admitting: Podiatry

## 2020-02-08 ENCOUNTER — Ambulatory Visit (INDEPENDENT_AMBULATORY_CARE_PROVIDER_SITE_OTHER): Payer: Medicare HMO | Admitting: Podiatry

## 2020-02-08 ENCOUNTER — Other Ambulatory Visit: Payer: Self-pay

## 2020-02-08 ENCOUNTER — Ambulatory Visit: Payer: TRICARE For Life (TFL) | Admitting: Podiatry

## 2020-02-08 DIAGNOSIS — E1142 Type 2 diabetes mellitus with diabetic polyneuropathy: Secondary | ICD-10-CM | POA: Diagnosis not present

## 2020-03-14 ENCOUNTER — Ambulatory Visit: Payer: TRICARE For Life (TFL) | Admitting: Podiatry

## 2020-04-09 NOTE — Progress Notes (Signed)
  Subjective:  Patient ID: Romelle Starcher, male    DOB: 12-28-1934,  MRN: [id number]  No chief complaint on file.   85 y.o. male presents with the above complaint. History confirmed with patient.  States that his wife trimmed his callus at the plantar forefoot and after he had some drainage Friday with discomfort but no redness.  His foot did swell a little bit.  Objective:  Physical Exam: warm, good capillary refill, nail exam normal nails without lesions, no trophic changes or ulcerative lesions. DP pulses palpable, PT pulses palpable and protective sensation absent Left Foot: normal exam, no swelling, tenderness, instability; ligaments intact, full range of motion of all ankle/foot joints  Right Foot: normal exam, no swelling, tenderness, instability; ligaments intact, full range of motion of all ankle/foot joints HPK submet 2,3 with deep excoriation but no open wound  No images are attached to the encounter.  Assessment:   1. DM type 2 with diabetic peripheral neuropathy ([place])      Plan:  Patient was evaluated and treated and all questions answered.  Diabetes and DPN -Intermedic ointment and Band-Aid applied.  No need for p.o. antibiotics.  Follow-up as scheduled for routine care  No follow-ups on file.

## 2022-04-26 NOTE — Progress Notes (Signed)
Patient's chart and all cardiac test reviewed. ECHO 02-2022 showed mod aortic stenosis which was a change for his ECHO in 2019 showing only aortic sclerosis. According to [facility] Guidelines, this patient is not a candidate for outpatient surgery. Kerri at [facility] office made aware.

## 2022-04-30 ENCOUNTER — Other Ambulatory Visit (HOSPITAL_COMMUNITY): Payer: Self-pay | Admitting: Orthopedic Surgery

## 2022-05-02 ENCOUNTER — Encounter (HOSPITAL_COMMUNITY): Payer: Self-pay | Admitting: Orthopedic Surgery

## 2022-05-02 ENCOUNTER — Other Ambulatory Visit: Payer: Self-pay

## 2022-05-02 NOTE — Progress Notes (Addendum)
Anesthesia Chart Review: SAME DAY WORK-UP  Case: [id number] Date/Time: 05/03/22 1145   Procedures:      2nd-5th HAMMER TOE CORRECTION (Right: Toe) - regional block  120     HALLUX METATARSAL PHALANGEAL JOINT ARTHRODESIS (Right: Foot) - REGIONAL BLOCK  120     2ND -4TH METATARSAL HEAD EXCISION (Right: Toe) - REGIONAL BLOCK  120   Anesthesia type: General   Pre-op diagnosis: right foot hammertoe   Location: [facility] OR ROOM 20 / [place] [facility]   Surgeons: Wylene Simmer, MD       DISCUSSION: Patient is an 87 year old male scheduled for the above procedure.  He was initially scheduled [facility], but moved to main OR due to progression of AS to moderate by 03/06/22 echo, normal LVEF.   History includes former smoker, HTN, DM2, aortic stenosis (moderate 02/2022), CKD, right inguinal hernia repair (2020), cholecystectomy, appendectomy, left TKA. 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  He had preoperative medical evaluation by Teressa Lower, MD on 02/22/22. Echocardiogram ordered due to murmur, edema, and SOB. 03/06/22 echo showed normal LVEF, moderate AS. He was referred to cardiology.  He was seen by cardiologist Blenda Bridegroom, MD on 04/05/22 as new patient for moderate AS and preoperative cardiac assessment. He noted that patient was asymptomatic and very functional at baseline (able to do yard work). He wrote, "Revised cardiac risk index score: 0, Class 1 risk, correlating with 3.9% risk of MACE at 30 days. Plan:  -- No indications for any cardiac interventions at this time. Patient is considered intermediate risk for surgery." He may ultimately need AVR or TAVR. Plan to repeat TTE in 3 months. Notes patient's EKG showed borderline first degree AV block and RBBB, so would consider pre-AVR PPM if requires surgery.  He is a same day work-up, so labs as indicated and anesthesia team evaluation on the day of surgery. Creatinine was 1.14 on 02/22/22. 04/05/22 EKG tracing  requested from [place].   VS: Ht '5\' 11"'$  (1.803 m)   Wt 78 kg   BMI 23.99 kg/m  04/05/22 ([facility]): Filed Vital Signs Vital Sign Reading Time Taken Comments  Blood Pressure 128/64 04/05/2022 8:43 AM EDT    Pulse 84 04/05/2022 8:43 AM EDT    Oxygen Saturation 98% 04/05/2022 8:43 AM EDT    Weight 77.6 kg (171 lb) 04/05/2022 8:43 AM EDT    Height 172.7 cm ('5\' 8"'$ ) 04/05/2022 8:43 AM EDT    Body Mass Index 26 04/05/2022 8:43 AM EDT     PROVIDERS: Angelina Sheriff, MD is listed as PCP. He had preoperative medical evaluation on 02/22/22 by Teressa Lower, MD ([facility])  Blenda Bridegroom, MD is cardiologist ([facility]) Biagio Quint, MD is nephrologist ([facility])   LABS: For day of surgery as indicated. As of 02/22/22, CMP was normal except mildly elevated glucose 116, A1c 6.0%. CBC on 12/09/32 showed WBC 4.9, H/H 12.6/37.3, PLT 149.     IMAGES: MRI Brain 12/09/21 ([facility]): IMPRESSION:  - No evidence of acute intracranial abnormality.  - 16 mm chronic infarct in the right cerebellar hemisphere, new from  the prior brain MRI of 03/22/2018.  - Mild-to-moderate chronic small vessel image changes within the  cerebral white matter, progressed.  - Mild-to-moderate generalized cerebral atrophy.    CTA Head/Neck 12/09/21 ([facility]): IMPRESSION:  CTA neck:  1. The common carotid, internal carotid and vertebral arteries are  patent within the neck without  hemodynamically significant stenosis.  Atherosclerotic plaque within both carotid systems within the neck,  and at the origin of the left vertebral artery, as described.  2. Aortic Atherosclerosis (ICD10-I70.0) and Emphysema (ICD10-J43.9).   CTA head:  1. No intracranial large vessel occlusion is identified.  2. Intracranial atherosclerotic disease, as described. Most notably,  calcified plaque results in up to moderate stenosis of the V4 right  vertebral artery.    PCXR 12/09/21 ([facility]): FINDINGS:  - Cardiac  silhouette top-normal in size. Small hiatal hernia. No  mediastinal or hilar masses.  - Lungs are hyperexpanded. Linear/reticular opacities are noted in the  lung bases consistent with atelectasis or scarring. Remainder of the  lungs is clear.  - No convincing pleural effusion or pneumothorax.  - Skeletal structures are grossly intact.  IMPRESSION:  No acute cardiopulmonary disease.     EKG: EKG 04/05/22 ([facility]): Per narrative in Care Everywhere: Sinus rhythm with occasional Premature ventricular complexes  Right bundle branch block  Confirmed by Gabrielle Dare [phone]) on 04/05/2022 10:12:14 AM    CV: Echo 03/06/22 ([facility]): SUMMARY  Previous study not available.  Image Quality: Technically difficult.  The left ventricular size is normal.  There is normal left ventricular wall thickness.  LV ejection fraction = 60-65%.  Left ventricular systolic function is normal.  The left ventricular wall motion is normal.  The left atrium is borderline dilated.  There is moderate aortic stenosis.  Aortic valve mean pressure gradient is 23 mmHg.  AS dimensionless index (VTI) is  0.30. The calculated aortic valve area using the continuity equation  is 1.08 cm2.  IVC size was moderately dilated.   Transcranial Doppler 03/22/18 ([facility]): Summary  Bilateral MCAs have normal mean velocities.  Bilateral ACA's, PCA, Vertebrals and the Basilar arteries have symmetrical low mean velocities.  No definite area of stenosis identified.   US Carotid 03/22/18 ([facility]): Summary  1-39% stenosis bilaterally in the internal carotid arteries. No significant  plaque noted.    Past Medical History:  Diagnosis Date   Diabetes mellitus without complication ([place])    Type II   History of kidney stones    Hypertension    Pneumonia     Past Surgical History:  Procedure Laterality Date   APPENDECTOMY     CHOLECYSTECTOMY     EYE SURGERY     Bilateral Cataracts   FINGER SURGERY Right    5th    HERNIA REPAIR Right    Groin    MEDICATIONS: No current facility-administered medications for this encounter.    Artificial Tear Solution (SOOTHE XP OP)   ezetimibe (ZETIA) 10 MG tablet   Ibuprofen-diphenhydrAMINE HCl (IBUPROFEN PM) 200-25 MG CAPS   JARDIANCE 10 MG TABS tablet   lisinopril (ZESTRIL) 2.5 MG tablet   omeprazole (PRILOSEC) 20 MG capsule   Povidone-Iodine (BETADINE EX)   rosuvastatin (CRESTOR) 40 MG tablet   aspirin EC 325 MG tablet    Myra Gianotti, PA-C Surgical Short Stay/Anesthesiology [facility] Phone [phone] [facility] Phone [phone] 05/02/2022 12:23 PM

## 2022-05-02 NOTE — Anesthesia Preprocedure Evaluation (Signed)
Anesthesia Evaluation  Patient identified by MRN, date of birth, ID band Patient awake    Reviewed: Allergy & Precautions, NPO status , Patient's Chart, lab work & pertinent test results  Airway Mallampati: II  TM Distance: >3 FB     Dental   Pulmonary pneumonia, former smoker,    breath sounds clear to auscultation       Cardiovascular hypertension,  Rhythm:Regular Rate:Normal  Echo 03/06/22 ([facility]): SUMMARY  Previous study not available.  Image Quality: Technically difficult.  The left ventricular size is normal.  There is normal left ventricular wall thickness.  LV ejection fraction = 60-65%.  Left ventricular systolic function is normal.  The left ventricular wall motion is normal.  The left atrium is borderline dilated.  There is moderate aortic stenosis.  Aortic valve mean pressure gradient is 23 mmHg.  AS dimensionless index (VTI) is  0.30. The calculated aortic valve area using the continuity equation  is 1.08 cm2.  IVC size was moderately dilated.     Neuro/Psych CVA    GI/Hepatic GERD  ,  Endo/Other  diabetes  Renal/GU Renal disease     Musculoskeletal   Abdominal   Peds  Hematology   Anesthesia Other Findings   Reproductive/Obstetrics                            Anesthesia Physical Anesthesia Plan  ASA: 3  Anesthesia Plan: General   Post-op Pain Management:    Induction: Intravenous  PONV Risk Score and Plan: 2 and Ondansetron  Airway Management Planned: LMA  Additional Equipment:   Intra-op Plan:   Post-operative Plan: Extubation in OR  Informed Consent: I have reviewed the patients History and Physical, chart, labs and discussed the procedure including the risks, benefits and alternatives for the proposed anesthesia with the patient or authorized representative who has indicated his/her understanding and acceptance.       Plan Discussed with: CRNA  and Anesthesiologist  Anesthesia Plan Comments: (PAT note written 05/02/2022 by Myra Gianotti, PA-C. )       Anesthesia Quick Evaluation

## 2022-05-02 NOTE — Progress Notes (Signed)
PCP - Dr. Trula Slade  Cardiologist - Dr. Maylene Roes  EP- Denies  Endocrine- Denies  Pulm- Denies  Chest x-ray - Denies  EKG - 05/03/22 -Day of surgery  Stress Test - Denies  ECHO - 11/30/21 (E)  Cardiac Cath - Denies  AICD-na PM-na LOOP-na  Nerve Stimulator- Denies  Dialysis- Denies  Sleep Study - Denies CPAP - Denies  LABS- CBC, BMP  ASA- LD- 1 week  ERAS- Yes- clears until 0900  HA1C- 02/22/22([facility]): 6.0 - Pt states he does not have to check his levels at home.  Anesthesia- Yes- pt's surgery moved to the main campus for echo results- notified anesthesia pa.  Pt denies having chest pain, sob, or fever during the pre-op phone call. All instructions explained to the pt, with a verbal understanding of the material including: as of today, stop taking all Aspirin (unless instructed by your doctor) and Other Aspirin containing products, Vitamins, Fish oils, and Herbal medications. Also stop all NSAIDS i.e. Advil, Ibuprofen, Motrin, Aleve, Anaprox, Naproxen, BC, Goody Powders, and all Supplements. Pt also instructed to wear a mask and social distance if he goes out. The opportunity to ask questions was provided.

## 2022-05-03 ENCOUNTER — Ambulatory Visit (HOSPITAL_COMMUNITY): Payer: Medicare HMO

## 2022-05-03 ENCOUNTER — Other Ambulatory Visit: Payer: Self-pay

## 2022-05-03 ENCOUNTER — Ambulatory Visit (HOSPITAL_COMMUNITY): Payer: Medicare HMO | Admitting: Vascular Surgery

## 2022-05-03 ENCOUNTER — Ambulatory Visit (HOSPITAL_COMMUNITY)
Admission: RE | Admit: 2022-05-03 | Discharge: 2022-05-03 | Disposition: A | Discharge status: Home / Self Care | Payer: Medicare HMO | Attending: Orthopedic Surgery | Admitting: Orthopedic Surgery

## 2022-05-03 ENCOUNTER — Ambulatory Visit (HOSPITAL_BASED_OUTPATIENT_CLINIC_OR_DEPARTMENT_OTHER): Payer: Medicare HMO | Admitting: Vascular Surgery

## 2022-05-03 ENCOUNTER — Encounter (HOSPITAL_COMMUNITY)
Admission: RE | Disposition: A | Discharge status: Home / Self Care | Payer: Self-pay | Source: Home / Self Care | Attending: Orthopedic Surgery

## 2022-05-03 ENCOUNTER — Encounter (HOSPITAL_COMMUNITY): Payer: Self-pay | Admitting: Orthopedic Surgery

## 2022-05-03 DIAGNOSIS — M7741 Metatarsalgia, right foot: Secondary | ICD-10-CM | POA: Insufficient documentation

## 2022-05-03 DIAGNOSIS — I350 Nonrheumatic aortic (valve) stenosis: Secondary | ICD-10-CM | POA: Diagnosis not present

## 2022-05-03 DIAGNOSIS — E119 Type 2 diabetes mellitus without complications: Secondary | ICD-10-CM | POA: Diagnosis not present

## 2022-05-03 DIAGNOSIS — I129 Hypertensive chronic kidney disease with stage 1 through stage 4 chronic kidney disease, or unspecified chronic kidney disease: Secondary | ICD-10-CM | POA: Insufficient documentation

## 2022-05-03 DIAGNOSIS — I10 Essential (primary) hypertension: Secondary | ICD-10-CM

## 2022-05-03 DIAGNOSIS — E1122 Type 2 diabetes mellitus with diabetic chronic kidney disease: Secondary | ICD-10-CM | POA: Insufficient documentation

## 2022-05-03 DIAGNOSIS — Z9049 Acquired absence of other specified parts of digestive tract: Secondary | ICD-10-CM | POA: Insufficient documentation

## 2022-05-03 DIAGNOSIS — M2041 Other hammer toe(s) (acquired), right foot: Secondary | ICD-10-CM | POA: Diagnosis not present

## 2022-05-03 DIAGNOSIS — Z87891 Personal history of nicotine dependence: Secondary | ICD-10-CM | POA: Insufficient documentation

## 2022-05-03 DIAGNOSIS — N189 Chronic kidney disease, unspecified: Secondary | ICD-10-CM | POA: Insufficient documentation

## 2022-05-03 DIAGNOSIS — Z7984 Long term (current) use of oral hypoglycemic drugs: Secondary | ICD-10-CM | POA: Insufficient documentation

## 2022-05-03 DIAGNOSIS — M2021 Hallux rigidus, right foot: Secondary | ICD-10-CM | POA: Diagnosis present

## 2022-05-03 HISTORY — DX: Type 2 diabetes mellitus without complications: E11.9

## 2022-05-03 HISTORY — DX: Pneumonia, unspecified organism: J18.9

## 2022-05-03 HISTORY — PX: METATARSAL HEAD EXCISION: SHX5027

## 2022-05-03 HISTORY — DX: Essential (primary) hypertension: I10

## 2022-05-03 HISTORY — DX: Chronic kidney disease, unspecified: N18.9

## 2022-05-03 HISTORY — DX: Nonrheumatic aortic (valve) stenosis: I35.0

## 2022-05-03 HISTORY — DX: Personal history of urinary calculi: Z87.442

## 2022-05-03 HISTORY — PX: FOOT ARTHRODESIS: SHX1655

## 2022-05-03 HISTORY — PX: HAMMER TOE SURGERY: SHX385

## 2022-05-03 LAB — CBC
HCT: 39.2 % (ref 39.0–52.0)
Hemoglobin: 13.1 g/dL (ref 13.0–17.0)
MCH: 31.7 pg (ref 26.0–34.0)
MCHC: 33.4 g/dL (ref 30.0–36.0)
MCV: 94.9 fL (ref 80.0–100.0)
Platelets: 159 10*3/uL (ref 150–400)
RBC: 4.13 MIL/uL — ABNORMAL LOW (ref 4.22–5.81)
RDW: 12.7 % (ref 11.5–15.5)
WBC: 5.2 10*3/uL (ref 4.0–10.5)
nRBC: 0 % (ref 0.0–0.2)

## 2022-05-03 LAB — BASIC METABOLIC PANEL
Anion gap: 5 (ref 5–15)
BUN: 10 mg/dL (ref 8–23)
CO2: 24 mmol/L (ref 22–32)
Calcium: 9 mg/dL (ref 8.9–10.3)
Chloride: 108 mmol/L (ref 98–111)
Creatinine, Ser: 1.17 mg/dL (ref 0.61–1.24)
GFR, Estimated: >60 mL/min (ref >60)
Glucose, Bld: 160 mg/dL — ABNORMAL HIGH (ref 70–99)
Potassium: 3.9 mmol/L (ref 3.5–5.1)
Sodium: 137 mmol/L (ref 135–145)

## 2022-05-03 LAB — GLUCOSE, CAPILLARY
Glucose-Capillary: 167 mg/dL — ABNORMAL HIGH (ref 70–99)
Glucose-Capillary: 124 mg/dL — ABNORMAL HIGH (ref 70–99)

## 2022-05-03 SURGERY — CORRECTION, HAMMER TOE
Anesthesia: General | Site: Toe | Laterality: Right

## 2022-05-03 MED ORDER — FENTANYL CITRATE (PF) 100 MCG/2ML IJ SOLN
75.0000 ug | Freq: Once | INTRAMUSCULAR | Status: AC
  Filled 2022-05-03: qty 1.5

## 2022-05-03 MED ORDER — ETOMIDATE 2 MG/ML IV SOLN
INTRAVENOUS | Status: DC | PRN
  Administered 2022-05-03: 8 mg via INTRAVENOUS

## 2022-05-03 MED ORDER — ONDANSETRON HCL 4 MG/2ML IJ SOLN
INTRAMUSCULAR | Status: DC | PRN
  Administered 2022-05-03: 4 mg via INTRAVENOUS

## 2022-05-03 MED ORDER — EPHEDRINE 5 MG/ML INJ
INTRAVENOUS | Status: AC
  Filled 2022-05-03: qty 5

## 2022-05-03 MED ORDER — OXYCODONE HCL 5 MG PO TABS: 5.0000 mg | ORAL_TABLET | Freq: Four times a day (QID) | ORAL | 0 refills | Status: AC | PRN

## 2022-05-03 MED ORDER — VANCOMYCIN HCL 1000 MG IV SOLR
INTRAVENOUS | Status: DC | PRN
  Administered 2022-05-03: 1000 mg

## 2022-05-03 MED ORDER — 0.9 % SODIUM CHLORIDE (POUR BTL) OPTIME
TOPICAL | Status: DC | PRN
  Administered 2022-05-03: 1000 mL

## 2022-05-03 MED ORDER — VANCOMYCIN HCL 1000 MG IV SOLR
INTRAVENOUS | Status: AC
  Filled 2022-05-03: qty 20

## 2022-05-03 MED ORDER — CHLORHEXIDINE GLUCONATE 0.12 % MT SOLN
15.0000 mL | Freq: Once | OROMUCOSAL | Status: AC
Start: 2022-05-03 — End: 2022-05-03

## 2022-05-03 MED ORDER — FENTANYL CITRATE (PF) 250 MCG/5ML IJ SOLN
INTRAMUSCULAR | Status: AC
  Filled 2022-05-03: qty 5

## 2022-05-03 MED ORDER — GLYCOPYRROLATE PF 0.2 MG/ML IJ SOSY
PREFILLED_SYRINGE | INTRAMUSCULAR | Status: DC | PRN
  Administered 2022-05-03 (×2): .1 mg via INTRAVENOUS

## 2022-05-03 MED ORDER — CEFAZOLIN SODIUM-DEXTROSE 2-4 GM/100ML-% IV SOLN
INTRAVENOUS | Status: AC
  Filled 2022-05-03: qty 100

## 2022-05-03 MED ORDER — ORAL CARE MOUTH RINSE: 15.0000 mL | Freq: Once | OROMUCOSAL | Status: AC

## 2022-05-03 MED ORDER — SODIUM CHLORIDE 0.9 % IV SOLN: INTRAVENOUS | Status: DC

## 2022-05-03 MED ORDER — CEFAZOLIN SODIUM-DEXTROSE 2-4 GM/100ML-% IV SOLN
2.0000 g | INTRAVENOUS | Status: AC
  Administered 2022-05-03: 2 g via INTRAVENOUS

## 2022-05-03 MED ORDER — LIDOCAINE HCL (CARDIAC) PF 100 MG/5ML IV SOSY
PREFILLED_SYRINGE | INTRAVENOUS | Status: DC | PRN
  Administered 2022-05-03: 10 mL via INTRAVENOUS

## 2022-05-03 MED ORDER — PHENYLEPHRINE 80 MCG/ML (10ML) SYRINGE FOR IV PUSH (FOR BLOOD PRESSURE SUPPORT)
PREFILLED_SYRINGE | INTRAVENOUS | Status: AC
Start: 2022-05-03 — End: ?
  Filled 2022-05-03: qty 10

## 2022-05-03 MED ORDER — PROPOFOL 10 MG/ML IV BOLUS
INTRAVENOUS | Status: AC
  Filled 2022-05-03: qty 20

## 2022-05-03 MED ORDER — PHENYLEPHRINE HCL-NACL 20-0.9 MG/250ML-% IV SOLN
INTRAVENOUS | Status: DC | PRN
  Administered 2022-05-03: 20 ug/min via INTRAVENOUS

## 2022-05-03 MED ORDER — FENTANYL CITRATE (PF) 100 MCG/2ML IJ SOLN
25.0000 ug | INTRAMUSCULAR | Status: DC | PRN
  Administered 2022-05-03 (×5): 25 ug via INTRAVENOUS

## 2022-05-03 MED ORDER — FENTANYL CITRATE (PF) 100 MCG/2ML IJ SOLN
INTRAMUSCULAR | Status: AC
  Filled 2022-05-03: qty 2

## 2022-05-03 MED ORDER — BACITRACIN ZINC 500 UNIT/GM EX OINT
TOPICAL_OINTMENT | CUTANEOUS | Status: AC
  Filled 2022-05-03: qty 28.35

## 2022-05-03 MED ORDER — EPHEDRINE SULFATE-NACL 50-0.9 MG/10ML-% IV SOSY
PREFILLED_SYRINGE | INTRAVENOUS | Status: DC | PRN
  Administered 2022-05-03 (×2): 5 mg via INTRAVENOUS

## 2022-05-03 MED ORDER — CHLORHEXIDINE GLUCONATE 0.12 % MT SOLN
OROMUCOSAL | Status: AC
  Administered 2022-05-03: 15 mL via OROMUCOSAL
  Filled 2022-05-03: qty 15

## 2022-05-03 MED ORDER — BUPIVACAINE HCL (PF) 0.5 % IJ SOLN
INTRAMUSCULAR | Status: DC | PRN
  Administered 2022-05-03: 15 mL

## 2022-05-03 MED ORDER — FENTANYL CITRATE (PF) 100 MCG/2ML IJ SOLN
INTRAMUSCULAR | Status: AC
  Administered 2022-05-03: 75 ug via INTRAVENOUS
  Filled 2022-05-03: qty 2

## 2022-05-03 MED ORDER — PROPOFOL 10 MG/ML IV BOLUS
INTRAVENOUS | Status: DC | PRN
  Administered 2022-05-03: 10 mg via INTRAVENOUS
  Administered 2022-05-03: 20 mg via INTRAVENOUS

## 2022-05-03 MED ORDER — FENTANYL CITRATE (PF) 250 MCG/5ML IJ SOLN
INTRAMUSCULAR | Status: DC | PRN
  Administered 2022-05-03: 25 ug via INTRAVENOUS

## 2022-05-03 SURGICAL SUPPLY — 70 items
BAG COUNTER SPONGE SURGICOUNT (BAG) ×3 IMPLANT
BANDAGE ESMARK 6X9 LF (GAUZE/BANDAGES/DRESSINGS) IMPLANT
BIT DRILL 5/64X5 DISP (BIT) IMPLANT
BIT DRILL 7/64X5 DISP (BIT) IMPLANT
BIT DRILL CAL 2.5 ST W/SLV (BIT) ×1 IMPLANT
BLADE LONG MED 31X9 (MISCELLANEOUS) ×1 IMPLANT
BLADE MICRO SAGITTAL (BLADE) ×3 IMPLANT
BLADE SAW SGTL 13X75X1.27 (BLADE) ×3 IMPLANT
BLADE SURG 10 STRL SS (BLADE) ×3 IMPLANT
BNDG COHESIVE 4X5 TAN STRL (GAUZE/BANDAGES/DRESSINGS) ×3 IMPLANT
BNDG COHESIVE 6X5 TAN STRL LF (GAUZE/BANDAGES/DRESSINGS) ×3 IMPLANT
BNDG ELASTIC 4X5.8 VLCR STR LF (GAUZE/BANDAGES/DRESSINGS) ×1 IMPLANT
BNDG ESMARK 6X9 LF (GAUZE/BANDAGES/DRESSINGS)
BOOT STEPPER DURA LG (SOFTGOODS) ×1 IMPLANT
CHLORAPREP W/TINT 26 (MISCELLANEOUS) ×3 IMPLANT
COVER SURGICAL LIGHT HANDLE (MISCELLANEOUS) ×2 IMPLANT
CUFF TOURN SGL QUICK 34 (TOURNIQUET CUFF) ×3
CUFF TOURN SGL QUICK 42 (TOURNIQUET CUFF) IMPLANT
CUFF TRNQT CYL 34X4.125X (TOURNIQUET CUFF) IMPLANT
DRAPE OEC MINIVIEW 54X84 (DRAPES) ×3 IMPLANT
DRAPE U-SHAPE 47X51 STRL (DRAPES) ×3 IMPLANT
DRSG MEPITEL 4X7.2 (GAUZE/BANDAGES/DRESSINGS) ×3 IMPLANT
DRSG PAD ABDOMINAL 8X10 ST (GAUZE/BANDAGES/DRESSINGS) ×6 IMPLANT
ELECT REM PT RETURN 9FT ADLT (ELECTROSURGICAL) ×3
ELECTRODE REM PT RTRN 9FT ADLT (ELECTROSURGICAL) ×2 IMPLANT
GAUZE PAD ABD 8X10 STRL (GAUZE/BANDAGES/DRESSINGS) ×2 IMPLANT
GAUZE SPONGE 4X4 12PLY STRL (GAUZE/BANDAGES/DRESSINGS) ×1 IMPLANT
GLOVE BIO SURGEON STRL SZ7 (GLOVE) ×6 IMPLANT
GLOVE BIOGEL PI IND STRL 7.5 (GLOVE) ×2 IMPLANT
GLOVE BIOGEL PI IND STRL 8 (GLOVE) ×4 IMPLANT
GLOVE BIOGEL PI INDICATOR 7.5 (GLOVE) ×1
GLOVE BIOGEL PI INDICATOR 8 (GLOVE) ×2
GLOVE SURG ENC MOIS LTX SZ8 (GLOVE) ×6 IMPLANT
GOWN STRL REUS W/ TWL LRG LVL3 (GOWN DISPOSABLE) ×2 IMPLANT
GOWN STRL REUS W/ TWL XL LVL3 (GOWN DISPOSABLE) ×4 IMPLANT
GOWN STRL REUS W/TWL LRG LVL3 (GOWN DISPOSABLE) ×6
GOWN STRL REUS W/TWL XL LVL3 (GOWN DISPOSABLE) ×6
K-WIRE ACE 1.6X6 (WIRE) ×6
KIT BASIN OR (CUSTOM PROCEDURE TRAY) ×3 IMPLANT
KIT INSTRUMENT MPJ STRATUM (KITS) ×1 IMPLANT
KIT TURNOVER KIT B (KITS) ×3 IMPLANT
KWIRE ACE 1.6X6 (WIRE) IMPLANT
MANIFOLD NEPTUNE II (INSTRUMENTS) ×2 IMPLANT
NS IRRIG 1000ML POUR BTL (IV SOLUTION) ×3 IMPLANT
PACK ORTHO EXTREMITY (CUSTOM PROCEDURE TRAY) ×3 IMPLANT
PAD ARMBOARD 7.5X6 YLW CONV (MISCELLANEOUS) ×6 IMPLANT
PAD CAST 4YDX4 CTTN HI CHSV (CAST SUPPLIES) IMPLANT
PADDING CAST COTTON 4X4 STRL (CAST SUPPLIES) ×3
PLATE MPJ 1ST STRM LG 0D RT (Plate) ×1 IMPLANT
SCREW  STRATUM NL LP ST 3.5X22 (Screw) ×3 IMPLANT
SCREW LOCK STRATUM 3.5X16 (Screw) ×1 IMPLANT
SCREW LOCK STRATUM 3.5X18 (Screw) ×1 IMPLANT
SCREW NLOCK LP ST STRM 3.5X14 (Screw) ×1 IMPLANT
SCREW STRATUM NL LP ST 3.5X22 (Screw) IMPLANT
SCREW STRM LOCK 3.5X14 (Screw) ×1 IMPLANT
SOAP 2 % CHG 4 OZ (WOUND CARE) ×3 IMPLANT
SPONGE SURGIFOAM ABS GEL 12-7 (HEMOSTASIS) IMPLANT
STAPLER VISISTAT 35W (STAPLE) IMPLANT
SUCTION FRAZIER HANDLE 10FR (MISCELLANEOUS) ×3
SUCTION TUBE FRAZIER 10FR DISP (MISCELLANEOUS) ×2 IMPLANT
SUT ETHILON 3 0 FSL (SUTURE) ×3 IMPLANT
SUT MNCRL AB 3-0 PS2 18 (SUTURE) ×3 IMPLANT
SUT PROLENE 3 0 PS 2 (SUTURE) IMPLANT
SUT VIC AB 2-0 CT1 27 (SUTURE) ×3
SUT VIC AB 2-0 CT1 TAPERPNT 27 (SUTURE) ×2 IMPLANT
SYR CONTROL 10ML LL (SYRINGE) IMPLANT
TOWEL GREEN STERILE (TOWEL DISPOSABLE) ×3 IMPLANT
TOWEL GREEN STERILE FF (TOWEL DISPOSABLE) ×3 IMPLANT
TUBE CONNECTING 12X1/4 (SUCTIONS) ×3 IMPLANT
WATER STERILE IRR 1000ML POUR (IV SOLUTION) ×3 IMPLANT

## 2022-05-03 NOTE — Anesthesia Procedure Notes (Signed)
Procedure Name: LMA Insertion Date/Time: 05/03/2022 11:35 AM  Performed by: Dorthea Cove, CRNAPre-anesthesia Checklist: Patient identified, Emergency Drugs available, Suction available and Patient being monitored Patient Re-evaluated:Patient Re-evaluated prior to induction Oxygen Delivery Method: Circle System Utilized Preoxygenation: Pre-oxygenation with 100% oxygen Induction Type: IV induction Ventilation: Mask ventilation without difficulty LMA: LMA inserted LMA Size: 4.0 Number of attempts: 1 Airway Equipment and Method: Bite block Placement Confirmation: positive ETCO2 Tube secured with: Tape Dental Injury: Teeth and Oropharynx as per pre-operative assessment

## 2022-05-03 NOTE — Transfer of Care (Signed)
Immediate Anesthesia Transfer of Care Note  Patient: Public relations account executive  Procedure(s) Performed: TWO-FIVE HAMMER TOE CORRECTION (Right: Toe) HALLUX METATARSAL PHALANGEAL JOINT ARTHRODESIS (Right: Foot) TWO-FOUR METATARSAL HEAD EXCISION (Right: Toe)  Patient Location: PACU  Anesthesia Type:General and Regional  Level of Consciousness: awake, alert  and oriented  Airway & Oxygen Therapy: Patient Spontanous Breathing  Post-op Assessment: Report given to RN  Post vital signs: Reviewed and stable  Last Vitals:  Vitals Value Taken Time  BP 110/87   Temp    Pulse 73   Resp 12   SpO2 93     Last Pain:  Vitals:   05/03/22 1010  PainSc: 0-No pain      Patients Stated Pain Goal: 3 (79/15/05 6979)  Complications: No notable events documented.

## 2022-05-03 NOTE — Anesthesia Procedure Notes (Addendum)
Anesthesia Regional Block: Ankle block   Pre-Anesthetic Checklist: , timeout performed,  Correct Patient, Correct Site, Correct Laterality,  Correct Procedure, Correct Position, site marked,  Risks and benefits discussed,  Surgical consent,  Pre-op evaluation,  At surgeon's request  Laterality: Right  Prep: chloraprep       Needles:        Needle Gauge: 25     Additional Needles:   Narrative:  Start time: 05/03/2022 11:10 AM End time: 05/03/2022 11:25 AM Injection made incrementally with aspirations every 5 mL.  Performed by: Other  Anesthesiologist: Belinda Block, MD

## 2022-05-03 NOTE — Discharge Instructions (Signed)
Harold Simmer, MD [facility]  Please read the following information regarding your care after surgery.  Medications  You only need a prescription for the narcotic pain medicine (ex. oxycodone, Percocet, Norco).  All of the other medicines listed below are available over the counter. X Aleve 2 pills twice a day for the first 3 days after surgery. X acetominophen (Tylenol) 650 mg every 4-6 hours as you need for minor to moderate pain X oxycodone as prescribed for severe pain  Narcotic pain medicine (ex. oxycodone, Percocet, Vicodin) will cause constipation.  To prevent this problem, take the following medicines while you are taking any pain medicine. X docusate sodium (Colace) 100 mg twice a day X senna (Senokot) 2 tablets twice a day  X To help prevent blood clots, take a baby aspirin (81 mg) twice a day for two weeks after surgery.  You should also get up every hour while you are awake to move around.    Weight Bearing X Bear weight only on your operated foot in the post-op boot.  Cast / Splint / Dressing  Keep your splint, cast or dressing clean and dry.  Don't put anything (coat hanger, pencil, etc) down inside of it.  If it gets damp, use a hair dryer on the cool setting to dry it.  If it gets soaked, call the office to schedule an appointment for a cast change.  After your dressing, cast or splint is removed; you may shower, but do not soak or scrub the wound.  Allow the water to run over it, and then gently pat it dry.  Swelling It is normal for you to have swelling where you had surgery.  To reduce swelling and pain, keep your toes above your nose for at least 3 days after surgery.  It may be necessary to keep your foot or leg elevated for several weeks.  If it hurts, it should be elevated.  Follow Up Call my office at [phone] when you are discharged from the hospital or surgery center to schedule an appointment to be seen two weeks after surgery.  Call my office at  [phone] if you develop a fever >101.5 F, nausea, vomiting, bleeding from the surgical site or severe pain.

## 2022-05-03 NOTE — H&P (Signed)
Harold Rogers is an 87 y.o. male.   Chief Complaint:  right forefoot pain HPI: 86 y/o male with PMH of diabetes and AS c/o R forefoot pain worsening over the last few years.  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  He also has metatarsalgia and 4-5 hammertoes.  He has failed non op treatment and presents today for correction of the right forefoot deformities.    Past Medical History:  Diagnosis Date   Aortic stenosis    03/06/33 echo ([facility]): LVEF 60-65%, moderate AS wiht mean grad 23 mmHg   CKD (chronic kidney disease)    Diabetes mellitus without complication ([facility])    Type II   History of kidney stones    Hypertension    Pneumonia     Past Surgical History:  Procedure Laterality Date   APPENDECTOMY     CHOLECYSTECTOMY     EYE SURGERY     Bilateral Cataracts   FINGER SURGERY Right    5th   HERNIA REPAIR Right    Groin    History reviewed. No pertinent family history. Social History:  reports that he has quit smoking. His smoking use included cigarettes. He has never used smokeless tobacco. He reports current alcohol use. He reports that he does not use drugs.  Allergies: No Known Allergies  Medications Prior to Admission  Medication Sig Dispense Refill   Artificial Tear Solution (SOOTHE XP OP) Place 1 drop into both eyes daily as needed (dry eyes).     aspirin EC 325 MG tablet Take 325 mg by mouth daily.     ezetimibe (ZETIA) 10 MG tablet Take 10 mg by mouth daily.     Ibuprofen-diphenhydrAMINE HCl (IBUPROFEN PM) 200-25 MG CAPS Take 2 tablets by mouth at bedtime as needed (sleep).     JARDIANCE 10 MG TABS tablet Take 10 mg by mouth daily.     lisinopril (ZESTRIL) 2.5 MG tablet Take 2.5 mg by mouth daily.     omeprazole (PRILOSEC) 20 MG capsule Take 20 mg by mouth daily.     Povidone-Iodine (BETADINE EX) Apply 1 Application topically at bedtime. Apply to foot     rosuvastatin (CRESTOR) 40 MG tablet Take 40 mg by mouth daily.       Results for orders placed or performed during the hospital encounter of 05/03/22 (from the past 48 hour(s))  Glucose, capillary     Status: Abnormal   Collection Time: 05/03/22  9:47 AM  Result Value Ref Range   Glucose-Capillary 167 (H) 70 - 99 mg/dL    Comment: Glucose reference range applies only to samples taken after fasting for at least 8 hours.  Basic metabolic panel per protocol     Status: Abnormal   Collection Time: 05/03/22  9:50 AM  Result Value Ref Range   Sodium 137 135 - 145 mmol/L   Potassium 3.9 3.5 - 5.1 mmol/L   Chloride 108 98 - 111 mmol/L   CO2 24 22 - 32 mmol/L   Glucose, Bld 160 (H) 70 - 99 mg/dL    Comment: Glucose reference range applies only to samples taken after fasting for at least 8 hours.   BUN 10 8 - 23 mg/dL   Creatinine, Ser 1.17 0.61 - 1.24 mg/dL   Calcium 9.0 8.9 - 10.3 mg/dL   GFR, Estimated >60 >60 mL/min    Comment: (NOTE) Calculated using the CKD-EPI Creatinine Equation (2021)    Anion gap 5 5 -  15    Comment: Performed at [facility], [place] [address], [place], [place] [address]  CBC per protocol     Status: Abnormal   Collection Time: 05/25/22  9:50 AM  Result Value Ref Range   WBC 5.2 4.0 - 10.5 K/uL   RBC 4.13 (L) 4.22 - 5.81 MIL/uL   Hemoglobin 13.1 13.0 - 17.0 g/dL   HCT 39.2 39.0 - 52.0 %   MCV 94.9 80.0 - 100.0 fL   MCH 31.7 26.0 - 34.0 pg   MCHC 33.4 30.0 - 36.0 g/dL   RDW 12.7 11.5 - 15.5 %   Platelets 159 150 - 400 K/uL   nRBC 0.0 0.0 - 0.2 %    Comment: Performed at [facility], [place] [address] [place], [place] [address]   No results found.  Review of Systems  no recent f/c/n/v/wt loss  Blood pressure (!) 147/78, pulse (!) 57, temperature (!) 97.5 F (36.4 C), resp. rate 18, height '5\' 11"'$  (1.803 m), weight 77.1 kg, SpO2 98 %. Physical Exam  75 wd elderly male in nad.  A and O.  HOH.  EOMI.  Resp unlabored.  R forefoot with severe bunion, 2-3 claw toes and 4-5 hammertoes.  Callous at plantar forefoot.   Toes are not passively correctable.  Pulses are palpable.  No signs of infection.  Intact sens to LT at the forefoot.  Assessment/Plan R bunion, clawtoes and hammertoes with metatarsalgia.  To the OR today for right hallux MPJ arthrodesis, 2-3 MT head excisions and 4-5 hammertoe corrections.  The risks and benefits of the alternative treatment options have been discussed in detail.  The patient wishes to proceed with surgery and specifically understands risks of bleeding, infection, nerve damage, blood clots, need for additional surgery, amputation and death.   Wylene Simmer, MD 05-25-2022, 10:52 AM

## 2022-05-03 NOTE — Progress Notes (Signed)
Orthopedic Tech Progress Note Patient Details:  Harold Rogers 07/04/1935 [id number] Large CAM Harold Rogers has been delivered to the OR desk  Ortho Devices Type of Ortho Device: CAM walker Ortho Device/Splint Interventions: Ordered      Harold Rogers Harold Rogers 05/03/2022, 1:21 PM

## 2022-05-03 NOTE — Op Note (Signed)
05/03/2022  1:47 PM  PATIENT:  Harold Rogers  87 y.o. male  PRE-OPERATIVE DIAGNOSIS: 1.  Right hallux rigidus      2.  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  Right 4-5 hammertoes      4.  Right forefoot metatarsalgia       POST-OPERATIVE DIAGNOSIS:  same  Procedure(s): Right hallux MPJ arthrodesis Right 2-4 MT head excisions Right 2-5 hammertoe corrections Right 3-5 flexor tenotomies Right 5th toe extensor tenotomies Right foot AP, lateral and oblique xrays  SURGEON:  Wylene Simmer, MD  ASSISTANT: none  ANESTHESIA:   General, regional  EBL:  minimal   TOURNIQUET:   Total Tourniquet Time Documented: Thigh (Right) - 61 minutes Total: Thigh (Right) - 61 minutes  COMPLICATIONS:  None apparent  DISPOSITION:  Extubated, awake and stable to recovery.  INDICATION FOR PROCEDURE: 87 year old male with a long history of right forefoot pain due to hallux rigidus, metatarsalgia, second and third claw toe deformities and fourth and fifth hammertoe deformities.  He has failed nonoperative treatment including activity modification, oral anti-inflammatories and shoewear modification.  He presents today for surgical treatment of these painful and limiting forefoot conditions.  The risks and benefits of the alternative treatment options have been discussed in detail.  The patient wishes to proceed with surgery and specifically understands risks of bleeding, infection, nerve damage, blood clots, need for additional surgery, amputation and death.   PROCEDURE IN DETAIL:  After pre operative consent was obtained, and the correct operative site was identified, the patient was brought to the operating room and placed supine on the OR table.  Anesthesia was administered.  Pre-operative antibiotics were administered.  A surgical timeout was taken.  Right lower extremity was prepped and draped in standard sterile fashion with a tourniquet around the thigh.  The extremity was elevated and the tourniquet was inflated  to 250 mmHg.  A longitudinal incision was made over the hallux MP joint.  Dissection was carried sharply down through the subcutaneous tissues.  The collateral ligaments were released exposing the metatarsal head.  A concave reamer was used to remove the remaining articular cartilage and subchondral bone.  A convex reamer was used to remove the remaining articular cartilage and subchondral bone from the base of the proximal phalanx.  The joint was reduced and held provisionally with a K wire.  The collateral ligaments were released exposing the metatarsal head.  A concave reamer was used to remove the remaining articular cartilage and subchondral bone.  A convex reamer was used to remove the remaining articular cartilage and subchondral bone from the base of the proximal phalanx.  The joint was reduced and held provisionally with a K wire.  Radiographs and a simulated weightbearing exam revealed appropriate alignment of the toe.  The joint was then fixed with a [employer] stratum plate.  It was used to compress the joint appropriately and secured with locking and nonlocking screws.  Radiographs confirmed appropriate position and length of all hardware and appropriate reduction of the toe.  The wound was irrigated copiously and sprinkled with vancomycin powder.  Subcutaneous tissues were approximated with Vicryl.  Skin incision was closed with nylon.  Attention was turned to the plantar aspect of the forefoot.  Percutaneous tenotomy's were performed at the third, fourth and fifth toes releasing the FDL from its insertion on the distal phalanx.  An ellipsoid incision was then made around the thick calluses beneath the second, third and fourth metatarsal heads.  Dissection was carried subcutaneously removing the ellipse of skin.  The second metatarsal head was then exposed with longitudinal dissection.  The plantar plate was incised.  Elevators were placed around the metatarsal neck protecting the neurovascular  structures medially and laterally.  The oscillating saw was used to cut through the neck of the metatarsal.  The metatarsal head was then removed in its entirety.  The same procedure was then performed for the third and fourth metatarsals.  Radiographs confirmed complete removal of all of the central metatarsal heads.  The second hammertoe deformity was then corrected with osteoclasis at the PIP joint.  A 1.6 mm K wire was then inserted from the tip of the toe across the IP joints.  The wire was then passed up the cut metatarsal neck to engage the base of the metatarsal.  AP and lateral radiographs confirmed appropriate position of the pin and appropriate reduction of the second toe.  The pin was then bent, trimmed and capped.  The same procedure was then performed for the third and fourth toes.  The fifth hammertoe deformity was corrected again with osteoclasis.  It was pinned with a K wire after percutaneously releasing the extensor tendons to the toe.  The tourniquet was released.  All 5 toes were noted to be well perfused at the end of the case.  The wound plantarly was irrigated copiously and sprinkled with vancomycin powder.  Skin incision was closed with horizontal mattress sutures of 3-0 nylon.  Sterile dressings were applied followed by a compression wrap.  The patient was awakened from anesthesia and transported to the recovery room in stable condition.   FOLLOW UP PLAN: Weightbearing as tolerated on the right foot in a cam boot.  Follow-up in the office in 2 weeks for suture removal.  Plan to pull the pins 6 weeks postop.  No indication for DVT prophylaxis in this ambulatory patient.   RADIOGRAPHS: AP, lateral and oblique radiographs of the right foot are obtained intraoperatively.  These show interval central metatarsal head excision and correction of the claw toe and hammertoe deformities.  Hardware is appropriately positioned and of the appropriate lengths.  Interval hallux MP joint  arthrodesis is also noted.

## 2022-05-04 NOTE — Anesthesia Postprocedure Evaluation (Signed)
Anesthesia Post Note  Patient: Public relations account executive  Procedure(s) Performed: TWO-FIVE HAMMER TOE CORRECTION (Right: Toe) HALLUX METATARSAL PHALANGEAL JOINT ARTHRODESIS (Right: Foot) TWO-FOUR METATARSAL HEAD EXCISION (Right: Toe)     Anesthesia Post Evaluation No notable events documented.  Last Vitals:  Vitals:   05/03/22 1400 05/03/22 1415  BP: (!) 142/69 128/74  Pulse: (!) 54 63  Resp: 12 15  Temp:  (!) 36.3 C  SpO2: 93% 93%    Last Pain:  Vitals:   05/03/22 1415  PainSc: 3                  Briceyda Abdullah

## 2022-05-07 ENCOUNTER — Encounter (HOSPITAL_COMMUNITY): Payer: Self-pay | Admitting: Orthopedic Surgery

## 2024-07-21 NOTE — Telephone Encounter (Signed)
Need [place] notes

## 2024-07-23 NOTE — Discharge Summary (Addendum)
 Hospital Medicine Discharge Summary   Demographics: Harold Rogers  89 y.o. 1935-10-01 MRN: [id number]    Extended Emergency Contact Information Primary Emergency Contact: [name],Don Mobile Phone: [phone] Relation: Son  Full Code  Admit Date: 07/21/2024                            Attending Physician: Zoaib Safdar Rasool, DO Discharge Date: 07/23/2024  Primary Care Provider: Lamar LITTIE Hobby, MD   [phone]  Consults during this admission: Consult Orders             IP CONSULT TO HOSPITALIST       Provider:  (Not yet assigned)              Active & Resolved Diagnosis: Principal Problem (Resolved):   UTI (urinary tract infection) Active Problems:   Essential hypertension   Mixed hyperlipidemia   Type 2 diabetes mellitus without complication, without long-term current use of insulin    (CMD)   Primary osteoarthritis of right knee   Vitamin B12 deficiency Resolved Problems:   Gastroesophageal reflux disease without esophagitis   Dehydration   Disposition: Patient discharged to SNF / Short term Rehab in stable condition.   Discharge follow-up recommendations : See Hospital Course  Scheduled Future Appointments       Provider Department Dept Phone Center   09/23/2024 11:00 AM [facility] PREM ECHO 1 [facility]  - [facility] [place] [phone] [facility]   09/29/2024 9:45 AM Carlin Hoe [facility]  - Heart & [facility] [place] [phone] [facility]   11/24/2024 10:30 AM Lamar LITTIE Hobby [facility]  - Primary Care [facility] [place] [phone] [facility] Course: Patient is a 89 year old male, past medical history of hypertension hyperlipidemia diabetes GERD who presented to hospital due to generalized weakness and chronic back pain.  Patient has a history of known compression fraction and has been having weakness.  Patient was found to have dehydration with  bacteriuria likely secondary to Farxiga.  Patient was discontinued on Farxiga after IV fluids.  Urine culture was negative.  Patient's antibiotics were discontinued.  Patient was eval by PT OT they recommended skilled nursing facility.  At this time patient will be discharged on lidocaine  patch and 7 days of Mobic.  Will avoid narcotics given patient's high likelihood of confusion/delirium.  At this time patient will be discharged to skilled nursing facility.  Given patient's myalgias CK was negative.  Unlikely secondary to statin therapy.  Patient will be continued on statin therapy.  Patient's other chronic comorbid medical conditions remained stable during the course of hospitalization.  Prior discharge of note.  To communication patient's family agreed with plan.    Wound / Incision Assessment: Refer to Chart Review and Media Tab for images if available.      Temp:  [97.2 F (36.2 C)-98.4 F (36.9 C)] 97.2 F (36.2 C) Heart Rate:  [58-89] 64 Resp:  [18-20] 18 BP: (108-133)/(56-66) 120/60 Physical Exam GEN: NAD, pleasant white male lying in bed EYES: EOMI ENT: MMM CV: RRR PULM: CTA B ABD: soft, NT/ND, +BS EXT: No edema. No erythema  Neuro: AO x 1-2 moving all 4 extremities MSK: Normal ROM      Discharge Medications     New Medications      Sig Disp Refill Start End  cyanocobalamin 1,000 mcg tablet Commonly  known as: VITAMIN B12  Take 1 tablet (1,000 mcg total) by mouth daily.  90 tablet  0     famotidine-Ca carb-mag hydrox 10-800-165 mg Chew Commonly known as: Pepcid Complete  Chew 1 tablet 4 (four) times a day. As needed reflux   0     lidocaine  5 % patch Commonly known as: LIDODERM   Apply 1 patch topically daily. Remove & discard patch within 12 hours or as directed by MD.  30 patch  0     meloxicam 7.5 mg tablet Commonly known as: MOBIC  Take 1 tablet (7.5 mg total) by mouth daily for 7 days.  7 tablet  0         Medications To Continue      Sig  Disp Refill Start End  aspirin 81 mg EC tablet  Take 1 tablet (81 mg total) by mouth daily.  120 tablet  0     calcium carbonate 500 mg (200 mg calcium) chewable tablet Commonly known as: TUMS  Chew 1-2 tablets every 6 (six) hours as needed.   0     calcium carbonate-vitamin D3 1,000 mg-20 mcg (800 unit) Tab  Take 1 tablet by mouth daily.  30 tablet  12     ezetimibe 10 mg tablet Commonly known as: ZETIA  TAKE 1 TABLET DAILY FOR    CHOLESTEROL  90 tablet  3     rosuvastatin 40 mg tablet Commonly known as: CRESTOR  TAKE 1 TABLET DAILY FOR    CHOLESTEROL  90 tablet  3         Stopped Medications    doxycycline 100 mg tablet Commonly known as: VIBRA-TABS   empagliflozin 25 mg Tab Commonly known as: Jardiance         Occupational Therapy Recommendations: Rehab Potential: Good Complexity/Co-morbidities that Impact POC: Infection Impairments/Limitations: Fine Motor deficits, Activity Tolerance Deficits, Balance Deficits, Safety Awareness deficits, IADL deficits, Basic Activity of Daily living deficits, Mobility deficits, Muscle weakness, Coordination Deficits, Decreased knowledge of condition   Physical Therapy Recommendations: Skilled Nursing Facility     Lab Results  Component Value Date/Time   HGB 13.6 (L) 07/21/2024 10:06 AM   HCT 38.9 (L) 07/21/2024 10:06 AM   WBC 6.45 07/21/2024 10:06 AM   PLT 144 (L) 07/21/2024 10:06 AM   Lab Results  Component Value Date/Time   NA 134 (L) 07/23/2024 02:45 AM   K 3.8 07/23/2024 02:45 AM   CREATININE 0.86 07/23/2024 02:45 AM   BUN 28 (H) 07/23/2024 02:45 AM   GLUCOSE 144 (H) 07/23/2024 02:45 AM    Pertinent Imaging: XR Shoulder Minimum 2 Views Right  Final Result by Glendia Alm Simas, MD [phone])  X-RAY SHOULDER RIGHT 2 VIEWS, 07/21/2024 3:25 PM    INDICATION: right shoulder pain     COMPARISON: None.    IMPRESSION:  1.  No acute fracture or traumatic malalignment.  2.  Moderate glenohumeral  and acromioclavicular joint degenerative   changes.  3.  Osteopenia.  4.  Thoracic aortic calcifications.  5.  Atelectasis or scarring in the right lung base.  6.  Partially visualized postoperative changes of plate fixation involving   the mandible.    US  Peripheral Venous Arm Unilat Left  Final Result by Jerilynn Cheryl Pouch, MD [phone])  [facility]                                                  [facility]                                                   V[facility]                                                   [address]                                                  [place]                                                   [place] [address]                                Upper Extremity  Venous Ultrasound Report  Name  Harold Rogers, Harold Rogers Date  07-21-2024 11 38 AM  MRN  [id number]                      Patient Location  [facility]  DOB  Aug 06, 1935                    Gender  Male  Age  58 yrs                        Ethnicity  1  Reason For Study  edema  Ordering Physician  MCPHERSON IV, EDGAR LEE  Referring Physician  MCPHERSON IV, EDGAR LEE  Performed By  ELIZABETHANN JANSKY  Interpretation Summary  The venous duplex examination of the left upper extremity shows evidence   of 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 The venous duplex examination of   the left upper extremity  shows no evidence of chronic or acute deep thrombosis.  Procedure  A unilateral venous duplex examination was completed using real-time   imaging with spectral and color   flow Doppler. the Internal Jugular, Subclavian, Axillary, Brachial,   Basilic and Cephalic veins were  evaluated of the  left  arm.  [id number] .  Left Shoulder  The internal jugular vein walls are fully compressible. Internal jugular   venous flow is spontaneous,  phasic and non-pulsatile. The subclavian vein walls are fully   compressible, where applicable.  Subclavian vein venous flow is spontaneous, phasic and non-pulsatile. The   axillary vein walls  compress completely. Axillary vein flow is spontaneous, phasic and   non-pulsatile.  Left Arm  Compression of the left brachial vein is complete. Spontaneous venous flow   in the left brachial vein  is present. Compression of the left radial vein is complete. Spontaneous   venous flow in the left  radial vein is present. Compression of the left ulnar vein is complete.   Spontaneous venous flow in  the left ulnar vein is present.  Left Superficial  Compression of the left cephalic vein is incomplete. The acoustic   characteristics of the thrombus in  the left cephalic vein suggests chronic thrombus. Compression of the left   basilic vein is complete.  Spontaneous venous flow in the left basilic vein is present.  ___________________________________________________________________________  ___  Electronically signed by MD Jerilynn Cheryl Pouch, MD, [phone]   on     07-22-2024 09 03 AM    CT Abdomen W Contrast  Final Result by Rita Chris, MD (10/14 1127)  CT ABDOMEN and pelvis W CONTRAST, 07/21/2024 11:13 AM    INDICATION: Epigastric pain   ADDITIONAL HISTORY: None.  COMPARISON: None.    TECHNIQUE: CT images of the abdomen and pelvis were obtained after   intravenous administration of iodinated contrast. Conventional axial   reconstructions and multiplanar reformatted images were submitted for   review.     FINDINGS:    . Lower Chest: Dense coronary artery calcification. Aortic annular   calcification. Trace bilateral  pleural effusions with associated   compressive atelectasis. Mild centrilobular emphysema. Moderate size   hiatal hernia.    . Liver: Punctate hypodensity in the hepatic dome, too small to   characterize. Mildly enlarged liver measuring 17.6 cm in craniocaudal   dimension.  . Gallbladder/Biliary: Cholecystectomy.  SABRA Spleen: Mildly enlarged spleen measuring 14.2 cm  in craniocaudal   dimension.  . Pancreas: Unremarkable.  . Adrenals: Mild bilateral adreniform thickening.  . Kidneys/Ureters: Symmetrical renal enhancement. No hydronephrosis or   nephrolithiasis. Simple bilateral multiple renal sinus and cortical cysts.    . Peritoneum/Mesenteries/Extraperitoneum: No free air. No free fluid or   loculated drainable collection. No pathologically enlarged lymph nodes.  . Gastrointestinal tract: No evidence of obstruction. Moderate colonic   stool burden. Colonic diverticulosis. Mild fecalith in the distal ileum,   likely related to slow transit.    SABRA Ureters: Unremarkable.  . Bladder: Unremarkable.  . Reproductive System: Prostatomegaly.    . Vascular: Atherosclerotic calcification of the aorta and its branches.  . Musculoskeletal: No acute displaced fractures. Polyarticular   degenerative changes. Multiple vertebral compression deformities correlate   with concurrent same day CT spine reformatted. No aggressive focal bony   lesions. Abdominal wall soft tissues unremarkable.    IMPRESSION:  1.  No acute abnormality in the abdomen or pelvis.  2.  Moderate size hiatal hernia.  3.  Mild hepatosplenomegaly.  4.  Trace bilateral pleural effusions.  5.  Multiple vertebral compression deformities correlate with concurrent   same day CT spine reformatted        CT Spine Lumbar Reformats W Contrast  Final Result by Ozell FORBES Relic, MD (10/14 1136)  CT LUMBAR SPINE REFORMATS WITH CONTRAST, 07/21/2024 11:13 AM    INDICATION: Low back pain, known compression fractures; CT in July for    comparison     COMPARISON: CT lumbar spine 04/14/2024    TECHNIQUE: Thin-section axial CT images of the entire lumbar spine were   acquired after intravenous administration of iodine-based contrast as part   of a body CT examination. A request was made for the original data set to   be reformatted in the coronal and sagittal planes using bone algorithm and   reviewed as a screen for bony spinal pathology.    All CT scans at [facility]   Imaging are performed using radiation dose optimization techniques as   appropriate to a performed exam, including but not limited to one or more   of the following: automatic exposure control, adjustment of the mA and/or   kV according to patient size, use of iterative reconstruction technique.   In addition, our institution participates in a radiation dose monitoring   program to optimize patient radiation exposure.    LEVELS IMAGED: Lower thoracic region to upper sacrum.    FINDINGS:  Alignment: Similar kyphotic deformity with apex at T11-T12. Similar mild   superior endplate retropulsion at T12 and L2. No acute traumatic alignment   compared to 04/14/2024.    Vertebrae: Chronic compression fractures of the T12 and L2 vertebral   bodies with similar degree of anterior height loss. Redemonstrated   endplate deformities at L5 with mild interval progression of central   height loss and increased patchy sclerosis within the vertebral body.   There is question of a nondisplaced fracture cleft subjacent to the   superior endplate (reference series 504, image 21). Mild superior endplate   retropulsion without associated canal compromise. Generalized osteopenia.    Degenerative changes: Multilevel disc height loss and facet hypertrophy   without high-grade canal stenosis. Foraminal stenosis is worst and   moderate degree at L2-L3, L4-L5, and L5-S1.    IMPRESSION:  1.  Redemonstrated endplate deformities at  L5 with mild interval   progression of central height loss and increased  patchy sclerosis within   the vertebral body. There is question of a nondisplaced fracture cleft   subjacent to the superior endplate, suggestive of an unhealed fracture.   2.  Similar chronic compression fractures of T12 and L2 with similar   degree of anterior height loss.  3.  Please reference the CT of the abdomen and pelvis for characterization   of all structures outside the lumbar spine.      XR Chest 1 View  Final Result by Simpson Ronold Rising, MD [phone])  XR CHEST 1 VIEW, 07/21/2024 10:37 AM    INDICATION:generalized weakness   COMPARISON: 07/13/2024.    FINDINGS:     Supportive devices: None  Cardiovascular/lungs/pleura: Cardiac silhouette and pulmonary vasculature   are within normal limits. Aortic arch calcifications.. Linear streaky   opacities in right lung base. No large pleural effusion or pneumothorax.   Other: Unremarkable.    IMPRESSION:  Linear streaky opacities in right lung base, favored to represent   subsegmental atelectasis/aspiration changes.          Electronically signed by: Zoaib Safdar Rasool, DO 07/23/2024 2:21 PM   Time spent on discharge: 42 minutes

## 2024-07-24 ENCOUNTER — Observation Stay (HOSPITAL_COMMUNITY)
Admission: EM | Admit: 2024-07-24 | Discharge: 2024-07-29 | Disposition: A | Discharge status: Home / Self Care | Attending: Family Medicine | Admitting: Family Medicine

## 2024-07-24 ENCOUNTER — Emergency Department (HOSPITAL_COMMUNITY)

## 2024-07-24 ENCOUNTER — Other Ambulatory Visit: Payer: Self-pay

## 2024-07-24 DIAGNOSIS — R109 Unspecified abdominal pain: Secondary | ICD-10-CM

## 2024-07-24 DIAGNOSIS — S32000A Wedge compression fracture of unspecified lumbar vertebra, initial encounter for closed fracture: Secondary | ICD-10-CM

## 2024-07-24 DIAGNOSIS — Z79899 Other long term (current) drug therapy: Secondary | ICD-10-CM | POA: Insufficient documentation

## 2024-07-24 DIAGNOSIS — J90 Pleural effusion, not elsewhere classified: Secondary | ICD-10-CM | POA: Insufficient documentation

## 2024-07-24 DIAGNOSIS — M5459 Other low back pain: Secondary | ICD-10-CM | POA: Diagnosis not present

## 2024-07-24 DIAGNOSIS — Z794 Long term (current) use of insulin: Secondary | ICD-10-CM | POA: Insufficient documentation

## 2024-07-24 DIAGNOSIS — J189 Pneumonia, unspecified organism: Secondary | ICD-10-CM

## 2024-07-24 DIAGNOSIS — M6281 Muscle weakness (generalized): Secondary | ICD-10-CM | POA: Insufficient documentation

## 2024-07-24 DIAGNOSIS — N189 Chronic kidney disease, unspecified: Secondary | ICD-10-CM | POA: Diagnosis not present

## 2024-07-24 DIAGNOSIS — D649 Anemia, unspecified: Secondary | ICD-10-CM | POA: Diagnosis not present

## 2024-07-24 DIAGNOSIS — Z7982 Long term (current) use of aspirin: Secondary | ICD-10-CM | POA: Diagnosis not present

## 2024-07-24 DIAGNOSIS — I129 Hypertensive chronic kidney disease with stage 1 through stage 4 chronic kidney disease, or unspecified chronic kidney disease: Secondary | ICD-10-CM | POA: Diagnosis not present

## 2024-07-24 DIAGNOSIS — Z87311 Personal history of (healed) other pathological fracture: Secondary | ICD-10-CM | POA: Diagnosis not present

## 2024-07-24 DIAGNOSIS — Z87891 Personal history of nicotine dependence: Secondary | ICD-10-CM | POA: Diagnosis not present

## 2024-07-24 DIAGNOSIS — R7989 Other specified abnormal findings of blood chemistry: Secondary | ICD-10-CM | POA: Diagnosis not present

## 2024-07-24 DIAGNOSIS — R531 Weakness: Secondary | ICD-10-CM

## 2024-07-24 DIAGNOSIS — K5900 Constipation, unspecified: Secondary | ICD-10-CM | POA: Insufficient documentation

## 2024-07-24 DIAGNOSIS — R918 Other nonspecific abnormal finding of lung field: Secondary | ICD-10-CM

## 2024-07-24 DIAGNOSIS — M549 Dorsalgia, unspecified: Secondary | ICD-10-CM

## 2024-07-24 DIAGNOSIS — E1122 Type 2 diabetes mellitus with diabetic chronic kidney disease: Secondary | ICD-10-CM | POA: Diagnosis not present

## 2024-07-24 DIAGNOSIS — F1090 Alcohol use, unspecified, uncomplicated: Secondary | ICD-10-CM | POA: Diagnosis not present

## 2024-07-24 DIAGNOSIS — R52 Pain, unspecified: Secondary | ICD-10-CM | POA: Diagnosis present

## 2024-07-24 LAB — CBC WITH DIFFERENTIAL/PLATELET
Abs Immature Granulocytes: 0.09 K/uL — ABNORMAL HIGH (ref 0.00–0.07)
Basophils Absolute: 0 K/uL (ref 0.0–0.1)
Basophils Relative: 0 %
Eosinophils Absolute: 0 K/uL (ref 0.0–0.5)
Eosinophils Relative: 0 %
HCT: 35 % — ABNORMAL LOW (ref 39.0–52.0)
Hemoglobin: 11.6 g/dL — ABNORMAL LOW (ref 13.0–17.0)
Immature Granulocytes: 1 %
Lymphocytes Relative: 10 %
Lymphs Abs: 0.7 K/uL (ref 0.7–4.0)
MCH: 31.3 pg (ref 26.0–34.0)
MCHC: 33.1 g/dL (ref 30.0–36.0)
MCV: 94.3 fL (ref 80.0–100.0)
Monocytes Absolute: 0.5 K/uL (ref 0.1–1.0)
Monocytes Relative: 6 %
Neutro Abs: 6 K/uL (ref 1.7–7.7)
Neutrophils Relative %: 83 %
Platelets: 291 K/uL (ref 150–400)
RBC: 3.71 MIL/uL — ABNORMAL LOW (ref 4.22–5.81)
RDW: 13.9 % (ref 11.5–15.5)
WBC: 7.4 K/uL (ref 4.0–10.5)
nRBC: 0 % (ref 0.0–0.2)

## 2024-07-24 LAB — TSH: TSH: 4.451 u[IU]/mL (ref 0.350–4.500)

## 2024-07-24 LAB — COMPREHENSIVE METABOLIC PANEL WITH GFR
ALT: 32 U/L (ref 0–44)
AST: 41 U/L (ref 15–41)
Albumin: 1.9 g/dL — ABNORMAL LOW (ref 3.5–5.0)
Alkaline Phosphatase: 55 U/L (ref 38–126)
Anion gap: 9 (ref 5–15)
BUN: 22 mg/dL (ref 8–23)
CO2: 22 mmol/L (ref 22–32)
Calcium: 7.8 mg/dL — ABNORMAL LOW (ref 8.9–10.3)
Chloride: 104 mmol/L (ref 98–111)
Creatinine, Ser: 0.89 mg/dL (ref 0.61–1.24)
GFR, Estimated: >60 mL/min (ref >60)
Glucose, Bld: 156 mg/dL — ABNORMAL HIGH (ref 70–99)
Potassium: 4.3 mmol/L (ref 3.5–5.1)
Sodium: 135 mmol/L (ref 135–145)
Total Bilirubin: 0.7 mg/dL (ref 0.0–1.2)
Total Protein: 4.8 g/dL — ABNORMAL LOW (ref 6.5–8.1)

## 2024-07-24 LAB — AMMONIA: Ammonia: 14 umol/L (ref 9–35)

## 2024-07-24 LAB — TROPONIN I (HIGH SENSITIVITY)
Troponin I (High Sensitivity): 371 ng/L (ref <18)
Troponin I (High Sensitivity): 273 ng/L (ref <18)

## 2024-07-24 LAB — CK: Total CK: 30 U/L — ABNORMAL LOW (ref 49–397)

## 2024-07-24 MED ORDER — IOHEXOL 350 MG/ML SOLN
75.0000 mL | Freq: Once | INTRAVENOUS | Status: AC | PRN
  Administered 2024-07-24: 75 mL via INTRAVENOUS

## 2024-07-24 MED ORDER — FENTANYL CITRATE (PF) 50 MCG/ML IJ SOSY
50.0000 ug | PREFILLED_SYRINGE | Freq: Once | INTRAMUSCULAR | Status: AC
  Administered 2024-07-24: 50 ug via INTRAVENOUS
  Filled 2024-07-24: qty 1

## 2024-07-24 MED ORDER — SODIUM CHLORIDE 0.9 % IV SOLN
1.0000 g | Freq: Once | INTRAVENOUS | Status: AC
  Administered 2024-07-24: 1 g via INTRAVENOUS
  Filled 2024-07-24: qty 10

## 2024-07-24 MED ORDER — SODIUM CHLORIDE 0.9 % IV BOLUS
500.0000 mL | Freq: Once | INTRAVENOUS | Status: AC
  Administered 2024-07-24: 500 mL via INTRAVENOUS

## 2024-07-24 MED ORDER — SODIUM CHLORIDE 0.9 % IV SOLN
500.0000 mg | Freq: Once | INTRAVENOUS | Status: AC
  Administered 2024-07-25: 500 mg via INTRAVENOUS
  Filled 2024-07-24: qty 5

## 2024-07-24 NOTE — Telephone Encounter (Signed)
 Wife called and states that patient is in real pain and she questions if she can speak to the nurse please.    [phone]

## 2024-07-24 NOTE — ED Triage Notes (Signed)
 Patient arrives from [facility] for eval of chronic BUE and BLE. Also states generalized back pain without injury which is normal for him. Receives no pain control at the facility but has been unable to complete any PT.

## 2024-07-24 NOTE — Telephone Encounter (Signed)
 Wife gives permission to speak to soon Oneil regarding patient care.

## 2024-07-24 NOTE — Telephone Encounter (Signed)
 Copied from CRM #[id number]. Topic: Clinical Concerns - Medical Question >> Jul 24, 2024 11:03 AM Elveria ORN wrote: Oneil Hugger is calling other request    Include all details related to the request(s) below:  Oneil (son/not listed on HIPAA) called to connect PCP and facility. Patient is in rehab and will need Home Health care. Son states that facility/hospital have not been able to contact the provider for coordination of care. Patient has a lot of pain and is mobile due to pain, has has had 2 recent hospital stays also. Medication changes also have been made during hospital stay.  Please contact  Byran Bilotti [phone] or Todd Hugger [phone]     Confirm and type the Best Contact Number below:  Patient/caller contact number: [phone]   Alonso)        [] Home  [] Mobile  [] Work [] Other   [] Okay to leave a voicemail   Medication List:  Current Outpatient Medications:  .  aspirin 81 mg EC tablet, Take 1 tablet (81 mg total) by mouth daily., Disp: 120 tablet, Rfl: 0 .  calcium carbonate (TUMS) 500 mg (200 mg calcium) chewable tablet, Chew 1-2 tablets every 6 (six) hours as needed., Disp: , Rfl:  .  calcium carbonate-vitamin D3 1,000 mg-20 mcg (800 unit) tab, Take 1 tablet by mouth daily., Disp: 30 tablet, Rfl: 12 .  cyanocobalamin (VITAMIN B12) 1,000 mcg tablet, Take 1 tablet (1,000 mcg total) by mouth daily., Disp: 90 tablet, Rfl: 0 .  ezetimibe (ZETIA) 10 mg tablet, TAKE 1 TABLET DAILY FOR    CHOLESTEROL, Disp: 90 tablet, Rfl: 3 .  famotidine-Ca carb-mag hydrox (Pepcid Complete) 10-800-165 mg chew, Chew 1 tablet 4 (four) times a day. As needed reflux, Disp: , Rfl:  .  lidocaine  (LIDODERM ) 5 % patch, Apply 1 patch topically daily. Remove & discard patch within 12 hours or as directed by MD., Disp: 30 patch, Rfl: 0 .  meloxicam (MOBIC) 7.5 mg tablet, Take 1 tablet (7.5 mg total) by mouth daily for 7 days., Disp: 7 tablet, Rfl: 0 .  rosuvastatin (CRESTOR) 40 mg tablet, TAKE 1  TABLET DAILY FOR    CHOLESTEROL, Disp: 90 tablet, Rfl: 3 No current facility-administered medications for this visit.     Medication Request/Refills: Pharmacy Information (if applicable)   [] Not Applicable       []  Pharmacy listed  Send Medication Request to:                                                 [] Pharmacy not listed (added to pharmacy list in Epic) Send Medication Request to:      Listed Pharmacies: [facility]

## 2024-07-24 NOTE — ED Provider Notes (Signed)
 [facility] AT [facility] Provider Note   CSN: [id number] Arrival date & time: 07/24/24  1732     Patient presents with: No chief complaint on file.   Yassen Bommarito is a 89 y.o. male.   The history is provided by the patient and medical records. No language interpreter was used.  Illness Location:  Diffuse fatigue, pain Severity:  Moderate Onset quality:  Gradual Duration:  4 days Timing:  Constant Chronicity:  New Associated symptoms: abdominal pain, cough, fatigue and shortness of breath   Associated symptoms: no chest pain, no congestion, no diarrhea, no headaches, no loss of consciousness, no nausea, no rash, no rhinorrhea, no vomiting and no wheezing        Prior to Admission medications   Medication Sig Start Date End Date Taking? Authorizing Provider  Artificial Tear Solution (SOOTHE XP OP) Place 1 drop into both eyes daily as needed (dry eyes).    [provider]  aspirin EC 325 MG tablet Take 325 mg by mouth daily.    [provider]  ezetimibe (ZETIA) 10 MG tablet Take 10 mg by mouth daily.    [provider]  Ibuprofen-diphenhydrAMINE HCl (IBUPROFEN PM) 200-25 MG CAPS Take 2 tablets by mouth at bedtime as needed (sleep).    [provider]  JARDIANCE 10 MG TABS tablet Take 10 mg by mouth daily. 02/02/19   [provider]  lisinopril (ZESTRIL) 2.5 MG tablet Take 2.5 mg by mouth daily. 02/15/19   [provider]  omeprazole (PRILOSEC) 20 MG capsule Take 20 mg by mouth daily. 04/11/22   [provider]  Povidone-Iodine (BETADINE EX) Apply 1 Application topically at bedtime. Apply to foot    [provider]  rosuvastatin (CRESTOR) 40 MG tablet Take 40 mg by mouth daily. 02/01/19   [provider]    Allergies: Patient has no known allergies.    Review of Systems  Constitutional:  Positive for fatigue. Negative for chills.  HENT:  Negative for congestion and  rhinorrhea.   Respiratory:  Positive for cough, chest tightness and shortness of breath. Negative for wheezing.   Cardiovascular:  Negative for chest pain, palpitations and leg swelling.  Gastrointestinal:  Positive for abdominal pain. Negative for constipation, diarrhea, nausea and vomiting.  Genitourinary:  Negative for dysuria and flank pain.  Musculoskeletal:  Positive for back pain. Negative for neck pain and neck stiffness.  Skin:  Negative for rash and wound.  Neurological:  Negative for loss of consciousness, weakness, light-headedness and headaches.  Psychiatric/Behavioral:  Negative for agitation and confusion.   All other systems reviewed and are negative.   Updated Vital Signs BP 130/73 (BP Location: Left Arm)   Pulse 77   Temp 98.2 F (36.8 C) (Oral)   Resp 20   SpO2 97%   Physical Exam Vitals and nursing note reviewed.  Constitutional:      General: He is not in acute distress.    Appearance: He is well-developed.  HENT:     Head: Normocephalic and atraumatic.  Eyes:     Conjunctiva/sclera: Conjunctivae normal.  Cardiovascular:     Rate and Rhythm: Normal rate and regular rhythm.     Heart sounds: No murmur heard. Pulmonary:     Effort: Pulmonary effort is normal. No respiratory distress.     Breath sounds: Normal breath sounds.  Abdominal:     Palpations: Abdomen is soft.     Tenderness: There is no abdominal tenderness.  Musculoskeletal:  General: Tenderness present. No swelling.     Cervical back: Neck supple.     Right lower leg: No edema.     Left lower leg: No edema.  Skin:    General: Skin is warm and dry.     Capillary Refill: Capillary refill takes less than 2 seconds.     Findings: No erythema or rash.  Neurological:     Mental Status: He is alert.     Sensory: No sensory deficit.     Motor: No weakness (symmetric).  Psychiatric:        Mood and Affect: Mood normal.     (all labs ordered are listed, but only abnormal results are  displayed) Labs Reviewed  CK - Abnormal; Notable for the following components:      Result Value   Total CK 30 (*)    All other components within normal limits  CBC WITH DIFFERENTIAL/PLATELET - Abnormal; Notable for the following components:   RBC 3.71 (*)    Hemoglobin 11.6 (*)    HCT 35.0 (*)    Abs Immature Granulocytes 0.09 (*)    All other components within normal limits  COMPREHENSIVE METABOLIC PANEL WITH GFR - Abnormal; Notable for the following components:   Glucose, Bld 156 (*)    Calcium 7.8 (*)    Total Protein 4.8 (*)    Albumin 1.9 (*)    All other components within normal limits  TROPONIN I (HIGH SENSITIVITY) - Abnormal; Notable for the following components:   Troponin I (High Sensitivity) 371 (*)    All other components within normal limits  TSH  AMMONIA  URINALYSIS, W/ REFLEX TO CULTURE (INFECTION SUSPECTED)  BRAIN NATRIURETIC PEPTIDE  TROPONIN I (HIGH SENSITIVITY)    EKG: EKG Interpretation Date/Time:  Friday July 24 2024 20:36:06 EDT Ventricular Rate:  72 PR Interval:  182 QRS Duration:  134 QT Interval:  414 QTC Calculation: 453 R Axis:   1  Text Interpretation: Normal sinus rhythm Right bundle branch block Abnormal ECG When compared with ECG of 29-Jun-2007 08:45, PREVIOUS ECG IS PRESENT when compared to prior, some t wave inversions in lead V3 No STEMI Confirmed by Ginger Barefoot ([id number]) on 07/24/2024 9:02:00 PM  Radiology: CT ABDOMEN PELVIS W CONTRAST Result Date: 07/24/2024 CLINICAL DATA:  Fatigue, rhonchi, abdominal pain EXAM: CT ANGIOGRAPHY CHEST CT ABDOMEN AND PELVIS WITH CONTRAST TECHNIQUE: Multidetector CT imaging of the chest was performed using the standard protocol during bolus administration of intravenous contrast. Multiplanar CT image reconstructions and MIPs were obtained to evaluate the vascular anatomy. Multidetector CT imaging of the abdomen and pelvis was performed using the standard protocol during bolus administration of  intravenous contrast. RADIATION DOSE REDUCTION: This exam was performed according to the departmental dose-optimization program which includes automated exposure control, adjustment of the mA and/or kV according to patient size and/or use of iterative reconstruction technique. CONTRAST:  75mL OMNIPAQUE IOHEXOL 350 MG/ML SOLN COMPARISON:  07/24/2024 FINDINGS: CTA CHEST FINDINGS Cardiovascular: This is a technically adequate evaluation of the pulmonary vasculature. No filling defects or pulmonary emboli. The heart is unremarkable without pericardial effusion. No evidence of thoracic aortic aneurysm or dissection. Atherosclerosis of the aorta and coronary vasculature. Mediastinum/Nodes: No enlarged mediastinal, hilar, or axillary lymph nodes. Thyroid  gland, trachea, and esophagus demonstrate no significant findings. Moderate hiatal hernia. Lungs/Pleura: There are small free-flowing bilateral pleural effusions, right greater than left. Compressive atelectasis within the dependent bilateral lower lobes. No acute airspace disease or pneumothorax. Central airways are patent. Upper lobe  predominant emphysema. Musculoskeletal: Chronic T3, T12, and L2 compression deformities. No acute bony abnormalities. Reconstructed images demonstrate no additional findings. Review of the MIP images confirms the above findings. CT ABDOMEN and PELVIS FINDINGS Hepatobiliary: No focal liver abnormality is seen. Status post cholecystectomy. No biliary dilatation. Pancreas: Unremarkable. No pancreatic ductal dilatation or surrounding inflammatory changes. Spleen: Normal in size without focal abnormality. Adrenals/Urinary Tract: The adrenals are unremarkable. Simple appearing bilateral renal cortical and peripelvic cysts do not require specific imaging follow-up. No obstructive uropathy within either kidney. Bladder is unremarkable. Stomach/Bowel: No bowel obstruction or ileus. Moderate hiatal hernia. Sigmoid diverticulosis without evidence of  acute diverticulitis. No bowel wall thickening or inflammatory change. Minimal retained stool throughout the colon. Vascular/Lymphatic: Diffuse aortic atherosclerosis. Multifocal high-grade stenoses within the bilateral common iliac and external iliac arteries. No pathologic adenopathy. Reproductive: The prostate is enlarged. Other: No free fluid or free intraperitoneal gas. No abdominal wall hernia. Musculoskeletal: No acute or destructive bony abnormalities. Chronic compression deformities of T12, L2, and L5. Reconstructed images demonstrate no additional findings. Review of the MIP images confirms the above findings. IMPRESSION: Chest: 1. No evidence of pulmonary embolus. 2. Small free-flowing bilateral pleural effusions and dependent lower lobe atelectasis. 3. Hiatal hernia. 4. Aortic Atherosclerosis (ICD10-I70.0) and Emphysema (ICD10-J43.9). Abdomen/pelvis: 1. Sigmoid diverticulosis without diverticulitis. 2. Enlarged prostate. 3. Aortic Atherosclerosis (ICD10-I70.0). Multifocal high-grade stenoses of the bilateral common and external iliac arteries. Electronically Signed   By: Ozell Daring M.D.   On: 07/24/2024 23:34   CT Angio Chest PE W and/or Wo Contrast Result Date: 07/24/2024 CLINICAL DATA:  Fatigue, rhonchi, abdominal pain EXAM: CT ANGIOGRAPHY CHEST CT ABDOMEN AND PELVIS WITH CONTRAST TECHNIQUE: Multidetector CT imaging of the chest was performed using the standard protocol during bolus administration of intravenous contrast. Multiplanar CT image reconstructions and MIPs were obtained to evaluate the vascular anatomy. Multidetector CT imaging of the abdomen and pelvis was performed using the standard protocol during bolus administration of intravenous contrast. RADIATION DOSE REDUCTION: This exam was performed according to the departmental dose-optimization program which includes automated exposure control, adjustment of the mA and/or kV according to patient size and/or use of iterative  reconstruction technique. CONTRAST:  75mL OMNIPAQUE IOHEXOL 350 MG/ML SOLN COMPARISON:  07/24/2024 FINDINGS: CTA CHEST FINDINGS Cardiovascular: This is a technically adequate evaluation of the pulmonary vasculature. No filling defects or pulmonary emboli. The heart is unremarkable without pericardial effusion. No evidence of thoracic aortic aneurysm or dissection. Atherosclerosis of the aorta and coronary vasculature. Mediastinum/Nodes: No enlarged mediastinal, hilar, or axillary lymph nodes. Thyroid  gland, trachea, and esophagus demonstrate no significant findings. Moderate hiatal hernia. Lungs/Pleura: There are small free-flowing bilateral pleural effusions, right greater than left. Compressive atelectasis within the dependent bilateral lower lobes. No acute airspace disease or pneumothorax. Central airways are patent. Upper lobe predominant emphysema. Musculoskeletal: Chronic T3, T12, and L2 compression deformities. No acute bony abnormalities. Reconstructed images demonstrate no additional findings. Review of the MIP images confirms the above findings. CT ABDOMEN and PELVIS FINDINGS Hepatobiliary: No focal liver abnormality is seen. Status post cholecystectomy. No biliary dilatation. Pancreas: Unremarkable. No pancreatic ductal dilatation or surrounding inflammatory changes. Spleen: Normal in size without focal abnormality. Adrenals/Urinary Tract: The adrenals are unremarkable. Simple appearing bilateral renal cortical and peripelvic cysts do not require specific imaging follow-up. No obstructive uropathy within either kidney. Bladder is unremarkable. Stomach/Bowel: No bowel obstruction or ileus. Moderate hiatal hernia. Sigmoid diverticulosis without evidence of acute diverticulitis. No bowel wall thickening or inflammatory change. Minimal retained stool throughout  the colon. Vascular/Lymphatic: Diffuse aortic atherosclerosis. Multifocal high-grade stenoses within the bilateral common iliac and external iliac  arteries. No pathologic adenopathy. Reproductive: The prostate is enlarged. Other: No free fluid or free intraperitoneal gas. No abdominal wall hernia. Musculoskeletal: No acute or destructive bony abnormalities. Chronic compression deformities of T12, L2, and L5. Reconstructed images demonstrate no additional findings. Review of the MIP images confirms the above findings. IMPRESSION: Chest: 1. No evidence of pulmonary embolus. 2. Small free-flowing bilateral pleural effusions and dependent lower lobe atelectasis. 3. Hiatal hernia. 4. Aortic Atherosclerosis (ICD10-I70.0) and Emphysema (ICD10-J43.9). Abdomen/pelvis: 1. Sigmoid diverticulosis without diverticulitis. 2. Enlarged prostate. 3. Aortic Atherosclerosis (ICD10-I70.0). Multifocal high-grade stenoses of the bilateral common and external iliac arteries. Electronically Signed   By: Ozell Daring M.D.   On: 07/24/2024 23:34   DG Chest 2 View Result Date: 07/24/2024 CLINICAL DATA:  Fatigue, rhonchi EXAM: CHEST - 2 VIEW COMPARISON:  12/09/2021 FINDINGS: Frontal and lateral views of the chest are obtained on 3 images. The evaluation is limited due to patient positioning and cooperation. Cardiac silhouette remains enlarged. Hiatal hernia again noted. There are bibasilar veiling opacities, right greater than left, consistent with effusions and underlying consolidation. No pneumothorax. IMPRESSION: 1. Bilateral pleural effusions and basilar consolidation, right greater than left. 2. Hiatal hernia. Electronically Signed   By: Ozell Daring M.D.   On: 07/24/2024 20:40     Procedures   CRITICAL CARE Performed by: Lonni PARAS Brax Walen Total critical care time: 30 minutes Critical care time was exclusive of separately billable procedures and treating other patients. Critical care was necessary to treat or prevent imminent or life-threatening deterioration. Critical care was time spent personally by me on the following activities: development of treatment  plan with patient and/or surrogate as well as nursing, discussions with consultants, evaluation of patient's response to treatment, examination of patient, obtaining history from patient or surrogate, ordering and performing treatments and interventions, ordering and review of laboratory studies, ordering and review of radiographic studies, pulse oximetry and re-evaluation of patient's condition.   Medications Ordered in the ED  cefTRIAXone (ROCEPHIN) 1 g in sodium chloride  0.9 % 100 mL IVPB (1 g Intravenous New Bag/Given 07/24/24 2300)  azithromycin (ZITHROMAX) 500 mg in sodium chloride  0.9 % 250 mL IVPB (has no administration in time range)  fentaNYL  (SUBLIMAZE ) injection 50 mcg (50 mcg Intravenous Given 07/24/24 2035)  sodium chloride  0.9 % bolus 500 mL (0 mLs Intravenous Stopped 07/24/24 2142)  iohexol (OMNIPAQUE) 350 MG/ML injection 75 mL (75 mLs Intravenous Contrast Given 07/24/24 2250)                                    Medical Decision Making Amount and/or Complexity of Data Reviewed Labs: ordered. Radiology: ordered.  Risk Prescription drug management. Decision regarding hospitalization.    Hollister Clairmont is a 89 y.o. male with a past medical history significant for surgery, hypertension, hyperlipidemia, previous abdominal surgeries with previous appendectomy cholecystectomy and hernia repair, CKD, kidney stones, aortic stenosis with murmur, and recent discharge from hospital to a skilled nursing facility yesterday who presents with worsening abdominal pain, no bowel movement, malaise, fatigue, body aches, dysuria, and generalized weakness with inability to ambulate.  Patient says that he has not been to walk for the last few days and he was walking last Sunday.  He reports he was discharged from the hospital yesterday to a skilled nursing facility and they said he  is in too much pain and cannot safely stand ambulate or participate in PT.  They sent him here for worsened symptoms  including the diffuse pain as well as the malaise fatigue and abdominal discomfort.  Patient is denying any headache and is denying significant neck pain.  He reports no chest pain but does have some shortness of breath when he tries to take a deep breath.  Reports it feels abnormal when he takes a deep breath.  He reports moderate to severe pain in his abdomen and his arms and legs.  Reports pain primarily in his joints.  He denies any recent tick exposures.  Denies any hematuria but does report some discomfort with urination.  He reports no change in his chronic back pain.  He denies any new trauma.  He denies any focal numbness and denies any unilateral weakness but has some generalized fatigue and weakness.  Family says he is not eating as much.  They are worried about him gradually worsening and he has not had a bowel movement in 5 days which is very different for him as well.  No history of bowel obstruction but is had previous abdominal surgeries.  Family is concerned that he is not safe in his current facility and they are concerned that something else is wrong.  On my exam, lungs did have rales and rhonchi.  He does have a murmur.  Chest was not tender but his abdomen was diffusely tender.  I did hear some bowel sounds.  Flanks and back are tender to palpation.  He does have intact sensation and pulses in extremities and had symmetric strength although his legs were symmetrically weak.  He could raise them both off of the bed.  Symmetric smile.  Clear speech.  Pupil symmetric and reactive with normal extract movement.  He was able to move his neck around without rigidity and low suspicion for meningitis at this time.  No rashes seen.  Given his no bowel movement for 5 days and discomfort, will get imaging to rule out bowel obstruction.  With his lung exam will get x-ray to look for developing pneumonia.  With his urine change will get urinalysis look for UTI.  Will get other screening labs given his  malaise fatigue and diffuse myalgias and soreness.  Will check a CK.  Will check electrolytes.  Will check ammonia given the fatigue as well.  Will check TSH.  Given his appearance and description of symptoms by family, and this worsening over the last few days, have low suspicion he will be safe for discharge home or to a facility at this time.  Anticipate admission when workup is completed.  9:39 PM Workup continues to return.  His x-ray shows concern for pleural effusions and consolidation.  Given the patient's rales and rhonchi, a slight cough when I was examining him and him telling me he has some  abnormal sensation when he takes a deep breath, I do feel I would like to treat him for possible pneumonia.  Will order some antibiotics and he will need admission.  His troponin did return at 371.  He still is denying any chest pain but we will get a repeat EKG.  With the positive troponin, some discomfort with deep breathing, and the abnormalities on chest x-ray, will order CT PE study to go along with his CT abdomen pelvis.  Will try to get patient into room and after his imaging and workup is completed he will need admission.  10:42  PM Will give antibiotics for what appears to be pneumonia and I suspect his troponin is probably demand ischemia given his lack of active chest pain.  Will call for admission.  CT scans did not show pulm embolism and did not show bowel obstruction.  Trend his troponin and he will be admitted for further management.       Final diagnoses:  Elevated troponin  Opacities of both lungs present on chest x-ray  Pleural effusion     Clinical Impression: 1. Elevated troponin   2. Opacities of both lungs present on chest x-ray   3. Pleural effusion     Disposition: Admit  This note was prepared with assistance of Dragon voice recognition software. Occasional wrong-word or sound-a-like substitutions may have occurred due to the inherent limitations of voice  recognition software.      Rakeisha Nyce, Lonni PARAS, MD 07/24/24 [phone]

## 2024-07-25 ENCOUNTER — Encounter (HOSPITAL_COMMUNITY): Payer: Self-pay | Admitting: Internal Medicine

## 2024-07-25 DIAGNOSIS — R109 Unspecified abdominal pain: Secondary | ICD-10-CM

## 2024-07-25 DIAGNOSIS — D649 Anemia, unspecified: Secondary | ICD-10-CM

## 2024-07-25 DIAGNOSIS — J90 Pleural effusion, not elsewhere classified: Secondary | ICD-10-CM

## 2024-07-25 DIAGNOSIS — R531 Weakness: Secondary | ICD-10-CM

## 2024-07-25 DIAGNOSIS — R7989 Other specified abnormal findings of blood chemistry: Principal | ICD-10-CM | POA: Diagnosis present

## 2024-07-25 LAB — GLUCOSE, CAPILLARY
Glucose-Capillary: 145 mg/dL — ABNORMAL HIGH (ref 70–99)
Glucose-Capillary: 144 mg/dL — ABNORMAL HIGH (ref 70–99)

## 2024-07-25 LAB — CBC
HCT: 35.7 % — ABNORMAL LOW (ref 39.0–52.0)
Hemoglobin: 11.5 g/dL — ABNORMAL LOW (ref 13.0–17.0)
MCH: 30.7 pg (ref 26.0–34.0)
MCHC: 32.2 g/dL (ref 30.0–36.0)
MCV: 95.5 fL (ref 80.0–100.0)
Platelets: 312 K/uL (ref 150–400)
RBC: 3.74 MIL/uL — ABNORMAL LOW (ref 4.22–5.81)
RDW: 14.1 % (ref 11.5–15.5)
WBC: 7.3 K/uL (ref 4.0–10.5)
nRBC: 0 % (ref 0.0–0.2)

## 2024-07-25 LAB — URINALYSIS, W/ REFLEX TO CULTURE (INFECTION SUSPECTED)
Bacteria, UA: NONE SEEN
Bilirubin Urine: NEGATIVE
Glucose, UA: >=500 mg/dL — AB
Hgb urine dipstick: NEGATIVE
Ketones, ur: NEGATIVE mg/dL
Leukocytes,Ua: NEGATIVE
Nitrite: NEGATIVE
Protein, ur: NEGATIVE mg/dL
Specific Gravity, Urine: 1.033 — ABNORMAL HIGH (ref 1.005–1.030)
pH: 5 (ref 5.0–8.0)

## 2024-07-25 LAB — CBG MONITORING, ED
Glucose-Capillary: 133 mg/dL — ABNORMAL HIGH (ref 70–99)
Glucose-Capillary: 157 mg/dL — ABNORMAL HIGH (ref 70–99)

## 2024-07-25 MED ORDER — EZETIMIBE 10 MG PO TABS
10.0000 mg | ORAL_TABLET | Freq: Every day | ORAL | Status: DC
  Administered 2024-07-25 – 2024-07-29 (×5): 10 mg via ORAL
  Filled 2024-07-25 (×5): qty 1

## 2024-07-25 MED ORDER — ACETAMINOPHEN 325 MG PO TABS
650.0000 mg | ORAL_TABLET | Freq: Four times a day (QID) | ORAL | Status: DC | PRN
  Administered 2024-07-25: 650 mg via ORAL
  Filled 2024-07-25: qty 2

## 2024-07-25 MED ORDER — OXYCODONE HCL 5 MG PO TABS
5.0000 mg | ORAL_TABLET | ORAL | Status: DC | PRN
  Administered 2024-07-25 – 2024-07-28 (×2): 5 mg via ORAL
  Filled 2024-07-25 (×2): qty 1

## 2024-07-25 MED ORDER — ENOXAPARIN SODIUM 40 MG/0.4ML IJ SOSY
40.0000 mg | PREFILLED_SYRINGE | INTRAMUSCULAR | Status: DC
  Administered 2024-07-25 – 2024-07-29 (×5): 40 mg via SUBCUTANEOUS
  Filled 2024-07-25 (×5): qty 0.4

## 2024-07-25 MED ORDER — CYCLOBENZAPRINE HCL 10 MG PO TABS
5.0000 mg | ORAL_TABLET | Freq: Three times a day (TID) | ORAL | Status: DC | PRN
  Administered 2024-07-25: 5 mg via ORAL
  Filled 2024-07-25: qty 1

## 2024-07-25 MED ORDER — INSULIN ASPART 100 UNIT/ML IJ SOLN
0.0000 [IU] | Freq: Three times a day (TID) | INTRAMUSCULAR | Status: DC
  Administered 2024-07-25: 2 [IU] via SUBCUTANEOUS
  Administered 2024-07-25 – 2024-07-26 (×3): 1 [IU] via SUBCUTANEOUS
  Administered 2024-07-26: 5 [IU] via SUBCUTANEOUS
  Administered 2024-07-27 (×2): 2 [IU] via SUBCUTANEOUS
  Administered 2024-07-27 – 2024-07-28 (×3): 1 [IU] via SUBCUTANEOUS
  Administered 2024-07-28 – 2024-07-29 (×2): 2 [IU] via SUBCUTANEOUS
  Administered 2024-07-29: 1 [IU] via SUBCUTANEOUS

## 2024-07-25 MED ORDER — INSULIN ASPART 100 UNIT/ML IJ SOLN
0.0000 [IU] | Freq: Every day | INTRAMUSCULAR | Status: DC
  Administered 2024-07-26 – 2024-07-28 (×2): 2 [IU] via SUBCUTANEOUS

## 2024-07-25 MED ORDER — ASPIRIN 81 MG PO TBEC
81.0000 mg | DELAYED_RELEASE_TABLET | Freq: Every day | ORAL | Status: DC
  Administered 2024-07-25 – 2024-07-29 (×5): 81 mg via ORAL
  Filled 2024-07-25 (×5): qty 1

## 2024-07-25 MED ORDER — ROSUVASTATIN CALCIUM 20 MG PO TABS
40.0000 mg | ORAL_TABLET | Freq: Every day | ORAL | Status: DC
  Administered 2024-07-25 – 2024-07-29 (×5): 40 mg via ORAL
  Filled 2024-07-25 (×5): qty 2

## 2024-07-25 MED ORDER — TRAMADOL HCL 50 MG PO TABS: 50.0000 mg | ORAL_TABLET | Freq: Two times a day (BID) | ORAL | Status: DC | PRN

## 2024-07-25 MED ORDER — ACETAMINOPHEN 650 MG RE SUPP: 650.0000 mg | Freq: Four times a day (QID) | RECTAL | Status: DC | PRN

## 2024-07-25 NOTE — Progress Notes (Signed)
 Orthopedic Tech Progress Note Patient Details:  Harold Rogers 1935-03-10 [id number] Patient stated numerous times that he did not want the TLSO brace  Patient ID: Harold Rogers, male   DOB: 1935-04-25, 89 y.o.   MRN: [id number]  Massie FORBES Bar 07/25/2024, 2:44 PM

## 2024-07-25 NOTE — ED Notes (Signed)
 Introduced self to patient at this time. Patient is resting in bed with visible chest rise and fall. The call light is in reach. Breakfast tray was set up. There are no further requests at this time.

## 2024-07-25 NOTE — ED Notes (Signed)
 The patient was changed into a new clean brief and sheets. One time non volume urine occurrence.

## 2024-07-25 NOTE — ED Notes (Signed)
Report given to inpatient nurse.

## 2024-07-25 NOTE — H&P (Signed)
 History and Physical    Harold Rogers FMW:[id number] DOB: Mar 27, 1935 DOA: 07/24/2024  PCP: Ofilia Lamar CROME, MD  Patient coming from: SNF  HPI: Harold Rogers is a 89 y.o. male with medical history significant of aortic stenosis, CKD, type 2 diabetes, hypertension, hyperlipidemia, GERD.  Recently admitted to [facility] 10/14-10/16/2025 for generalized weakness and chronic back pain in the setting of history of known compression fracture.  Patient was found to have dehydration and bacteriuria likely secondary to [place] she was treated with IV fluids and Farxiga was discontinued.  Urine culture was negative and antibiotics were discontinued.  Patient had also complained of myalgias but CK was negative and statin therapy continued.  Discharged to SNF.    Patient presents to the ED from SNF.  He is complaining of generalized bodyaches/joint pains all over.  Reporting lower abdominal/suprapubic pain and dysuria.  States his last bowel movement was yesterday but he was previously not having bowel movements due to not eating.  Denies nausea or vomiting.  Denies cough, shortness of breath, or chest pain.  No other complaints.  ED Course: Vital signs on arrival: Temperature 98.2 F, pulse 77, respiratory rate 20, blood pressure 130/73, and SpO2 97% on room air.  Labs showing no leukocytosis, hemoglobin 11.6 (was 13.6 on 10/14 and 11.9 on 10/8), MCV 94.3, glucose 156, creatinine 0.89, calcium 7.8, total protein 4.8, albumin 1.9, normal LFTs, CK 30, TSH normal, ammonia level normal, UA not suggestive of infection, BNP pending, troponin 371> 273.  EKG showing sinus rhythm, right bundle branch block, T wave inversions in inferior leads.  No previous EKG for comparison.  CTA chest negative for PE.  Showing small free-flowing bilateral pleural effusions and dependent lower lobe atelectasis.  CT abdomen pelvis with contrast showing multifocal high-grade stenosis of the bilateral common and external iliac arteries.   Patient was given fentanyl , ceftriaxone, azithromycin, and 500 mL IV fluids.  TRH called to admit.  Review of Systems:  Review of Systems  All other systems reviewed and are negative.   Past Medical History:  Diagnosis Date   Aortic stenosis    03/06/33 echo ([facility]): LVEF 60-65%, moderate AS wiht mean grad 23 mmHg   CKD (chronic kidney disease)    Diabetes mellitus without complication (HCC)    Type II   History of kidney stones    Hypertension    Pneumonia     Past Surgical History:  Procedure Laterality Date   APPENDECTOMY     CHOLECYSTECTOMY     EYE SURGERY     Bilateral Cataracts   FINGER SURGERY Right    5th   FOOT ARTHRODESIS Right 05/03/2022   Procedure: HALLUX METATARSAL PHALANGEAL JOINT ARTHRODESIS;  Surgeon: Kit Rush, MD;  Location: [facility] OR;  Service: Orthopedics;  Laterality: Right;  REGIONAL BLOCK  120   HAMMER TOE SURGERY Right 05/03/2022   Procedure: TWO-FIVE HAMMER TOE CORRECTION;  Surgeon: Kit Rush, MD;  Location: [facility] OR;  Service: Orthopedics;  Laterality: Right;   HERNIA REPAIR Right    Groin   METATARSAL HEAD EXCISION Right 05/03/2022   Procedure: TWO-FOUR METATARSAL HEAD EXCISION;  Surgeon: Kit Rush, MD;  Location: [facility] OR;  Service: Orthopedics;  Laterality: Right;     reports that he has quit smoking. His smoking use included cigarettes. He has never used smokeless tobacco. He reports current alcohol use. He reports that he does not use drugs.  No Known Allergies  No family history on file.  Prior to Admission medications  Medication Sig Start Date End Date Taking? Authorizing Provider  aspirin EC 81 MG tablet Take 81 mg by mouth daily. 07/15/24 11/12/24 Yes [provider]  Calcium Carb-Cholecalciferol 500-5 MG-MCG TABS Take 1-2 tablets by mouth every 6 (six) hours as needed (acid reflux).   Yes [provider]  Calcium Carb-Cholecalciferol 600-20 MG-MCG CHEW Chew 1 each by mouth in the morning. 07/25/24  Yes [provider]  Calcium Citrate-Vitamin D 315-6.25 MG-MCG TABS Take 1 tablet by mouth in the morning. 07/24/24  Yes [provider]  cyanocobalamin (VITAMIN B12) 1000 MCG tablet Take 1,000 mcg by mouth daily. 07/23/24 10/21/24 Yes [provider]  ezetimibe (ZETIA) 10 MG tablet Take 10 mg by mouth daily.   Yes [provider]  famotidine-calcium carbonate-magnesium hydroxide (PEPCID COMPLETE) 10-800-165 MG chewable tablet Chew 1 tablet by mouth every 6 (six) hours as needed. 05/05/24  Yes [provider]  meloxicam (MOBIC) 7.5 MG tablet Take 7.5 mg by mouth daily. 07/23/24 07/31/24 Yes [provider]  rosuvastatin (CRESTOR) 40 MG tablet Take 40 mg by mouth daily. 07/24/24  Yes [provider]    Physical Exam: Vitals:   07/25/24 0245 07/25/24 0430 07/25/24 0445 07/25/24 0448  BP: 105/63 97/61 103/62   Pulse: 69 74 75   Resp: (!) 23 (!) 27 (!) 26   Temp:    (!) 97.4 F (36.3 C)  TempSrc:    Temporal  SpO2: 96% 96% 95%     Physical Exam Vitals reviewed.  Constitutional:      General: He is not in acute distress. HENT:     Head: Normocephalic and atraumatic.  Cardiovascular:     Rate and Rhythm: Normal rate and regular rhythm.     Heart sounds: Murmur heard.  Pulmonary:     Effort: Pulmonary effort is normal. No respiratory distress.     Breath sounds: No wheezing or rales.  Abdominal:     General: Bowel sounds are normal. There is no distension.     Palpations: Abdomen is soft.     Tenderness: There is no abdominal tenderness. There is no guarding.  Musculoskeletal:     Cervical back: Normal range of motion.     Right lower leg: No edema.     Left lower leg: No edema.  Skin:    General: Skin is warm and dry.  Neurological:     General: No focal deficit present.     Mental Status: He is alert and oriented to person, place, and time.     Comments: Generalized weakness, no focal motor deficit     Labs on Admission: I  have personally reviewed following labs and imaging studies  CBC: Recent Labs  Lab 07/24/24 2028  WBC 7.4  NEUTROABS 6.0  HGB 11.6*  HCT 35.0*  MCV 94.3  PLT 291   Basic Metabolic Panel: Recent Labs  Lab 07/24/24 2028  NA 135  K 4.3  CL 104  CO2 22  GLUCOSE 156*  BUN 22  CREATININE 0.89  CALCIUM 7.8*   GFR: CrCl cannot be calculated (Unknown ideal weight.). Liver Function Tests: Recent Labs  Lab 07/24/24 2028  AST 41  ALT 32  ALKPHOS 55  BILITOT 0.7  PROT 4.8*  ALBUMIN 1.9*   No results for input(s): LIPASE, AMYLASE in the last 168 hours. Recent Labs  Lab 07/24/24 2028  AMMONIA 14   Coagulation Profile: No results for input(s): INR, PROTIME in the last 168 hours. Cardiac Enzymes: Recent  Labs  Lab 07/24/24 2028  CKTOTAL 30*   BNP (last 3 results) No results for input(s): PROBNP in the last 8760 hours. HbA1C: No results for input(s): HGBA1C in the last 72 hours. CBG: No results for input(s): GLUCAP in the last 168 hours. Lipid Profile: No results for input(s): CHOL, HDL, LDLCALC, TRIG, CHOLHDL, LDLDIRECT in the last 72 hours. Thyroid  Function Tests: Recent Labs    07/24/24 2028  TSH 4.451   Anemia Panel: No results for input(s): VITAMINB12, FOLATE, FERRITIN, TIBC, IRON, RETICCTPCT in the last 72 hours. Urine analysis:    Component Value Date/Time   COLORURINE YELLOW 07/25/2024 0226   APPEARANCEUR CLEAR 07/25/2024 0226   LABSPEC 1.033 (H) 07/25/2024 0226   PHURINE 5.0 07/25/2024 0226   GLUCOSEU >=500 (A) 07/25/2024 0226   HGBUR NEGATIVE 07/25/2024 0226   BILIRUBINUR NEGATIVE 07/25/2024 0226   KETONESUR NEGATIVE 07/25/2024 0226   PROTEINUR NEGATIVE 07/25/2024 0226   NITRITE NEGATIVE 07/25/2024 0226   LEUKOCYTESUR NEGATIVE 07/25/2024 0226    Radiological Exams on Admission: CT ABDOMEN PELVIS W CONTRAST Result Date: 07/24/2024 CLINICAL DATA:  Fatigue, rhonchi, abdominal pain EXAM: CT ANGIOGRAPHY  CHEST CT ABDOMEN AND PELVIS WITH CONTRAST TECHNIQUE: Multidetector CT imaging of the chest was performed using the standard protocol during bolus administration of intravenous contrast. Multiplanar CT image reconstructions and MIPs were obtained to evaluate the vascular anatomy. Multidetector CT imaging of the abdomen and pelvis was performed using the standard protocol during bolus administration of intravenous contrast. RADIATION DOSE REDUCTION: This exam was performed according to the departmental dose-optimization program which includes automated exposure control, adjustment of the mA and/or kV according to patient size and/or use of iterative reconstruction technique. CONTRAST:  75mL OMNIPAQUE IOHEXOL 350 MG/ML SOLN COMPARISON:  07/24/2024 FINDINGS: CTA CHEST FINDINGS Cardiovascular: This is a technically adequate evaluation of the pulmonary vasculature. No filling defects or pulmonary emboli. The heart is unremarkable without pericardial effusion. No evidence of thoracic aortic aneurysm or dissection. Atherosclerosis of the aorta and coronary vasculature. Mediastinum/Nodes: No enlarged mediastinal, hilar, or axillary lymph nodes. Thyroid  gland, trachea, and esophagus demonstrate no significant findings. Moderate hiatal hernia. Lungs/Pleura: There are small free-flowing bilateral pleural effusions, right greater than left. Compressive atelectasis within the dependent bilateral lower lobes. No acute airspace disease or pneumothorax. Central airways are patent. Upper lobe predominant emphysema. Musculoskeletal: Chronic T3, T12, and L2 compression deformities. No acute bony abnormalities. Reconstructed images demonstrate no additional findings. Review of the MIP images confirms the above findings. CT ABDOMEN and PELVIS FINDINGS Hepatobiliary: No focal liver abnormality is seen. Status post cholecystectomy. No biliary dilatation. Pancreas: Unremarkable. No pancreatic ductal dilatation or surrounding inflammatory  changes. Spleen: Normal in size without focal abnormality. Adrenals/Urinary Tract: The adrenals are unremarkable. Simple appearing bilateral renal cortical and peripelvic cysts do not require specific imaging follow-up. No obstructive uropathy within either kidney. Bladder is unremarkable. Stomach/Bowel: No bowel obstruction or ileus. Moderate hiatal hernia. Sigmoid diverticulosis without evidence of acute diverticulitis. No bowel wall thickening or inflammatory change. Minimal retained stool throughout the colon. Vascular/Lymphatic: Diffuse aortic atherosclerosis. Multifocal high-grade stenoses within the bilateral common iliac and external iliac arteries. No pathologic adenopathy. Reproductive: The prostate is enlarged. Other: No free fluid or free intraperitoneal gas. No abdominal wall hernia. Musculoskeletal: No acute or destructive bony abnormalities. Chronic compression deformities of T12, L2, and L5. Reconstructed images demonstrate no additional findings. Review of the MIP images confirms the above findings. IMPRESSION: Chest: 1. No evidence of pulmonary embolus. 2. Small free-flowing bilateral pleural effusions  and dependent lower lobe atelectasis. 3. Hiatal hernia. 4. Aortic Atherosclerosis (ICD10-I70.0) and Emphysema (ICD10-J43.9). Abdomen/pelvis: 1. Sigmoid diverticulosis without diverticulitis. 2. Enlarged prostate. 3. Aortic Atherosclerosis (ICD10-I70.0). Multifocal high-grade stenoses of the bilateral common and external iliac arteries. Electronically Signed   By: Ozell Daring M.D.   On: 07/24/2024 23:34   CT Angio Chest PE W and/or Wo Contrast Result Date: 07/24/2024 CLINICAL DATA:  Fatigue, rhonchi, abdominal pain EXAM: CT ANGIOGRAPHY CHEST CT ABDOMEN AND PELVIS WITH CONTRAST TECHNIQUE: Multidetector CT imaging of the chest was performed using the standard protocol during bolus administration of intravenous contrast. Multiplanar CT image reconstructions and MIPs were obtained to evaluate the  vascular anatomy. Multidetector CT imaging of the abdomen and pelvis was performed using the standard protocol during bolus administration of intravenous contrast. RADIATION DOSE REDUCTION: This exam was performed according to the departmental dose-optimization program which includes automated exposure control, adjustment of the mA and/or kV according to patient size and/or use of iterative reconstruction technique. CONTRAST:  75mL OMNIPAQUE IOHEXOL 350 MG/ML SOLN COMPARISON:  07/24/2024 FINDINGS: CTA CHEST FINDINGS Cardiovascular: This is a technically adequate evaluation of the pulmonary vasculature. No filling defects or pulmonary emboli. The heart is unremarkable without pericardial effusion. No evidence of thoracic aortic aneurysm or dissection. Atherosclerosis of the aorta and coronary vasculature. Mediastinum/Nodes: No enlarged mediastinal, hilar, or axillary lymph nodes. Thyroid  gland, trachea, and esophagus demonstrate no significant findings. Moderate hiatal hernia. Lungs/Pleura: There are small free-flowing bilateral pleural effusions, right greater than left. Compressive atelectasis within the dependent bilateral lower lobes. No acute airspace disease or pneumothorax. Central airways are patent. Upper lobe predominant emphysema. Musculoskeletal: Chronic T3, T12, and L2 compression deformities. No acute bony abnormalities. Reconstructed images demonstrate no additional findings. Review of the MIP images confirms the above findings. CT ABDOMEN and PELVIS FINDINGS Hepatobiliary: No focal liver abnormality is seen. Status post cholecystectomy. No biliary dilatation. Pancreas: Unremarkable. No pancreatic ductal dilatation or surrounding inflammatory changes. Spleen: Normal in size without focal abnormality. Adrenals/Urinary Tract: The adrenals are unremarkable. Simple appearing bilateral renal cortical and peripelvic cysts do not require specific imaging follow-up. No obstructive uropathy within either  kidney. Bladder is unremarkable. Stomach/Bowel: No bowel obstruction or ileus. Moderate hiatal hernia. Sigmoid diverticulosis without evidence of acute diverticulitis. No bowel wall thickening or inflammatory change. Minimal retained stool throughout the colon. Vascular/Lymphatic: Diffuse aortic atherosclerosis. Multifocal high-grade stenoses within the bilateral common iliac and external iliac arteries. No pathologic adenopathy. Reproductive: The prostate is enlarged. Other: No free fluid or free intraperitoneal gas. No abdominal wall hernia. Musculoskeletal: No acute or destructive bony abnormalities. Chronic compression deformities of T12, L2, and L5. Reconstructed images demonstrate no additional findings. Review of the MIP images confirms the above findings. IMPRESSION: Chest: 1. No evidence of pulmonary embolus. 2. Small free-flowing bilateral pleural effusions and dependent lower lobe atelectasis. 3. Hiatal hernia. 4. Aortic Atherosclerosis (ICD10-I70.0) and Emphysema (ICD10-J43.9). Abdomen/pelvis: 1. Sigmoid diverticulosis without diverticulitis. 2. Enlarged prostate. 3. Aortic Atherosclerosis (ICD10-I70.0). Multifocal high-grade stenoses of the bilateral common and external iliac arteries. Electronically Signed   By: Ozell Daring M.D.   On: 07/24/2024 23:34   DG Chest 2 View Result Date: 07/24/2024 CLINICAL DATA:  Fatigue, rhonchi EXAM: CHEST - 2 VIEW COMPARISON:  12/09/2021 FINDINGS: Frontal and lateral views of the chest are obtained on 3 images. The evaluation is limited due to patient positioning and cooperation. Cardiac silhouette remains enlarged. Hiatal hernia again noted. There are bibasilar veiling opacities, right greater than left, consistent with effusions and underlying  consolidation. No pneumothorax. IMPRESSION: 1. Bilateral pleural effusions and basilar consolidation, right greater than left. 2. Hiatal hernia. Electronically Signed   By: Ozell Daring M.D.   On: 07/24/2024 20:40     Assessment and Plan  Elevated troponin Troponin 371> 273.  EKG showing right bundle branch block, T wave inversions in inferior leads.  No previous EKG in the chart for comparison.  Patient denies chest pain and resting comfortably.  CTA chest negative for PE.  Echocardiogram ordered.  Abdominal pain Patient is endorsing suprapubic pain and dysuria.  Abdominal exam benign.  Recently admitted to [facility] 10/14-10/16 and per discharge summary urine culture was negative.  UA done at this time not suggestive of infection.  No fever or leukocytosis.  Will add on urine culture to confirm.  CT abdomen pelvis with contrast showing multifocal high-grade stenosis of the bilateral common and external iliac arteries.  Consult vascular surgery in the morning.  Chronic normocytic anemia Hemoglobin 11.6 (was 13.6 on 10/14 and 11.9 on 10/8).  No symptoms of GI bleeding or any other bleeding reported.  Monitor CBC.  Small bilateral pleural effusions Noted on CT.  BNP pending.  Generalized weakness PT/OT eval, fall precautions.  Type 2 diabetes Hemoglobin A1c 6.9 on labs 4 days ago.  Placed on sensitive sliding scale insulin ACHS.  Hyperlipidemia Continue ezetimibe and rosuvastatin.  DVT prophylaxis: Lovenox Code Status: DNR/DNI (discussed with the patient) Family Communication: No family available at this time. Level of care: Progressive Care Unit Admission status: It is my clinical opinion that referral for OBSERVATION is reasonable and necessary in this patient based on the above information provided. The aforementioned taken together are felt to place the patient at high risk for further clinical deterioration. However, it is anticipated that the patient may be medically stable for discharge from the hospital within 24 to 48 hours.  Editha Ram MD [facility]  If 7PM-7AM, please contact night-coverage [email or web address]  07/25/2024, 5:10 AM

## 2024-07-25 NOTE — Evaluation (Signed)
 Physical Therapy Evaluation Patient Details Name: Harold Rogers MRN: [id number] DOB: May 09, 1935 Today's Date: 07/25/2024  History of Present Illness  Harold Rogers is a 89 y.o. male admitted 10/17 with abdominal pain and elevated troponin and decline at SNF.   Has compression fxs and TLSO was ordered. PMH: aortic stenosis, CKD, type 2 diabetes, hypertension, hyperlipidemia, GERD.  Recently admitted to [facility] 10/14-10/16/2025 for generalized weakness and chronic back pain in the setting of history of known compression fracture.  Patient was found to have dehydration and bacteriuria and sent to SNF10/16.  Clinical Impression  Pt admitted with above diagnosis. Pt was able to sit edge of stretcher with min to mod assist to sit with pt needing UE support as he maintains posterior lean.Pt could not stand with +1 max assist due to posterior lean. Will follow acutely and progress as able. Pt will likely need post acute rehab < 3 hours day.  Pt currently with functional limitations due to the deficits listed below (see PT Problem List). Pt will benefit from acute skilled PT to increase their independence and safety with mobility to allow discharge.           If plan is discharge home, recommend the following: A lot of help with walking and/or transfers;A lot of help with bathing/dressing/bathroom;Assistance with cooking/housework;Assist for transportation;Help with stairs or ramp for entrance   Can travel by private vehicle   No    Equipment Recommendations Wheelchair (measurements PT);Wheelchair cushion (measurements PT);Hospital bed  Recommendations for Other Services       Functional Status Assessment Patient has had a recent decline in their functional status and demonstrates the ability to make significant improvements in function in a reasonable and predictable amount of time.     Precautions / Restrictions Precautions Precautions: Fall Required Braces or Orthoses: Spinal  Brace Spinal Brace: Thoracolumbosacral orthotic;Applied in sitting position (only has to use with ambulation per order) Restrictions Weight Bearing Restrictions Per Provider Order: No      Mobility  Bed Mobility Overal bed mobility: Needs Assistance Bed Mobility: Rolling, Sidelying to Sit, Sit to Sidelying Rolling: Max assist Sidelying to sit: Max assist     Sit to sidelying: Total assist General bed mobility comments: Pt requiring a lot of assist to come to EOB.  Did have some pain at times.    Transfers Overall transfer level: Needs assistance Equipment used: 2 person hand held assist Transfers: Sit to/from Stand Sit to Stand: Total assist, From elevated surface           General transfer comment: unable to stand with UE support and external support therefore scooted pt to his right to [facility].    Ambulation/Gait                  Stairs            Wheelchair Mobility     Tilt Bed    Modified Rankin (Stroke Patients Only)       Balance Overall balance assessment: Needs assistance Sitting-balance support: No upper extremity supported, Feet supported, Bilateral upper extremity supported Sitting balance-Leahy Scale: Poor Sitting balance - Comments: Needs UE support and external support to sit on edge of stretcher for up to 10 min.  Called nurse to show her pt had rash on back. Postural control: Posterior lean  Pertinent Vitals/Pain Pain Assessment Pain Assessment: Faces Faces Pain Scale: Hurts worst Pain Location: back Pain Descriptors / Indicators: Discomfort, Grimacing, Guarding Pain Intervention(s): Limited activity within patient's tolerance, Monitored during session, Repositioned, Premedicated before session    Home Living Family/patient expects to be discharged to:: Private residence Living Arrangements: Spouse/significant other Available Help at Discharge: Family;Available 24 hours/day  (supervision) Type of Home: House Home Access: Stairs to enter Entrance Stairs-Rails: Right (wall on left) Entrance Stairs-Number of Steps: 5   Home Layout: One level Home Equipment: BSC/3in1;Cane - single point;Rolling Walker (2 wheels);Shower seat;Grab bars - toilet;Grab bars - tub/shower;Hand held shower head      Prior Function Prior Level of Function : Independent/Modified Independent;Driving (up until 3 weeks ago; mod to max assist with recent SNF)             Mobility Comments: up until 3 weeks ago walking well without device ADLs Comments: B/D self     Extremity/Trunk Assessment   Upper Extremity Assessment Upper Extremity Assessment: Defer to OT evaluation    Lower Extremity Assessment Lower Extremity Assessment: RLE deficits/detail;LLE deficits/detail RLE Deficits / Details: grossly 2+/5 LLE Deficits / Details: grossly 2+/5    Cervical / Trunk Assessment Cervical / Trunk Assessment: Kyphotic  Communication   Communication Communication: Impaired Factors Affecting Communication: Hearing impaired    Cognition Arousal: Alert Behavior During Therapy: WFL for tasks assessed/performed   PT - Cognitive impairments: No apparent impairments                         Following commands: Intact       Cueing       General Comments General comments (skin integrity, edema, etc.): 84 bpm, 97% RA, 116/70    Exercises General Exercises - Lower Extremity Ankle Circles/Pumps: AROM, Both, 5 reps, Supine Quad Sets: AROM, Both, 5 reps, Supine Heel Slides: AROM, Both, 10 reps, Supine   Assessment/Plan    PT Assessment Patient needs continued PT services  PT Problem List Decreased activity tolerance;Decreased balance;Decreased mobility;Decreased knowledge of use of DME;Decreased safety awareness;Decreased knowledge of precautions       PT Treatment Interventions DME instruction;Gait training;Functional mobility training;Therapeutic activities;Therapeutic  exercise;Balance training;Patient/family education    PT Goals (Current goals can be found in the Care Plan section)  Acute Rehab PT Goals Patient Stated Goal: to go home per pt but wife states pt needs Rehab PT Goal Formulation: With patient/family Time For Goal Achievement: 08/08/24 Potential to Achieve Goals: Fair    Frequency Min 2X/week     Co-evaluation               AM-PAC PT 6 Clicks Mobility  Outcome Measure Help needed turning from your back to your side while in a flat bed without using bedrails?: A Lot Help needed moving from lying on your back to sitting on the side of a flat bed without using bedrails?: A Lot Help needed moving to and from a bed to a chair (including a wheelchair)?: Total Help needed standing up from a chair using your arms (e.g., wheelchair or bedside chair)?: Total Help needed to walk in hospital room?: Total Help needed climbing 3-5 steps with a railing? : Total 6 Click Score: 8    End of Session Equipment Utilized During Treatment: Gait belt Activity Tolerance: Patient limited by fatigue Patient left: with call bell/phone within reach;with family/visitor present (on stretcher) Nurse Communication: Mobility status;Need for lift equipment PT Visit Diagnosis: Unsteadiness on feet (  R26.81);Muscle weakness (generalized) (M62.81)    Time: 8673-8597 PT Time Calculation (min) (ACUTE ONLY): 36 min   Charges:   PT Evaluation $PT Eval Moderate Complexity: 1 Mod PT Treatments $Therapeutic Activity: 8-22 mins PT General Charges $$ ACUTE PT VISIT: 1 Visit         Harold Rogers M,PT Acute Rehab Services [phone]   Harold Rogers 07/25/2024, 3:42 PM

## 2024-07-25 NOTE — ED Notes (Signed)
 Notified provider Arlon, DO of family concerns.

## 2024-07-26 ENCOUNTER — Observation Stay (HOSPITAL_COMMUNITY)

## 2024-07-26 DIAGNOSIS — I4510 Unspecified right bundle-branch block: Secondary | ICD-10-CM | POA: Diagnosis not present

## 2024-07-26 DIAGNOSIS — S32000S Wedge compression fracture of unspecified lumbar vertebra, sequela: Secondary | ICD-10-CM | POA: Diagnosis not present

## 2024-07-26 DIAGNOSIS — S32000A Wedge compression fracture of unspecified lumbar vertebra, initial encounter for closed fracture: Secondary | ICD-10-CM

## 2024-07-26 DIAGNOSIS — R7989 Other specified abnormal findings of blood chemistry: Secondary | ICD-10-CM

## 2024-07-26 DIAGNOSIS — M549 Dorsalgia, unspecified: Secondary | ICD-10-CM | POA: Diagnosis not present

## 2024-07-26 DIAGNOSIS — R531 Weakness: Secondary | ICD-10-CM | POA: Diagnosis not present

## 2024-07-26 LAB — GLUCOSE, CAPILLARY
Glucose-Capillary: 126 mg/dL — ABNORMAL HIGH (ref 70–99)
Glucose-Capillary: 252 mg/dL — ABNORMAL HIGH (ref 70–99)
Glucose-Capillary: 138 mg/dL — ABNORMAL HIGH (ref 70–99)
Glucose-Capillary: 204 mg/dL — ABNORMAL HIGH (ref 70–99)

## 2024-07-26 LAB — ECHOCARDIOGRAM COMPLETE
AR max vel: 0.89 cm2
AV Area VTI: 1.09 cm2
AV Area mean vel: 0.95 cm2
AV Mean grad: 14 mmHg
AV Peak grad: 30.9 mmHg
Ao pk vel: 2.78 m/s
Area-P 1/2: 2.29 cm2
Height: 71 in
S' Lateral: 3.3 cm
Weight: 2716.07 [oz_av]

## 2024-07-26 LAB — URINE CULTURE: Culture: NO GROWTH

## 2024-07-26 MED ORDER — LIDOCAINE 5 % EX PTCH
1.0000 | MEDICATED_PATCH | CUTANEOUS | Status: DC
  Administered 2024-07-26 – 2024-07-28 (×3): 1 via TRANSDERMAL
  Filled 2024-07-26 (×3): qty 1

## 2024-07-26 NOTE — Progress Notes (Signed)
  Echocardiogram 2D Echocardiogram has been performed.  LAMON Jaroszewski 07/26/2024, 9:56 AM

## 2024-07-26 NOTE — Evaluation (Signed)
 Occupational Therapy Evaluation Patient Details Name: Harold Rogers MRN: [id number] DOB: [birth year] Today's Date: 07/26/2024   History of Present Illness   Harold Rogers is a 89 y.o. male admitted 10/17 with abdominal pain and elevated troponin and decline at SNF.   Has compression fxs and TLSO was ordered. PMH: aortic stenosis, CKD, type 2 diabetes, hypertension, hyperlipidemia, GERD.  Recently admitted to [facility] 10/14-10/16/2025 for generalized weakness and chronic back pain in the setting of history of known compression fracture.  Patient was found to have dehydration and bacteriuria and sent to SNF10/16.     Clinical Impressions Pt admitted based on above, and was seen based on problem list below. Prior to recent [facility] admission pt was independent with ADLs and IADLs. Today pt is requiring set up  to total assist for ADLs. Bed mobility and functional transfers are  max +2  with use of RW.  Pt would benefit from <3 hours of skilled rehab daily. OT will continue to follow acutely to maximize functional independence.        If plan is discharge home, recommend the following:   Two people to help with walking and/or transfers;Two people to help with bathing/dressing/bathroom;Supervision due to cognitive status     Functional Status Assessment   Patient has had a recent decline in their functional status and demonstrates the ability to make significant improvements in function in a reasonable and predictable amount of time.     Equipment Recommendations   Other (comment) (Defer to next venue)      Precautions/Restrictions   Precautions Precautions: Fall Recall of Precautions/Restrictions: Impaired Required Braces or Orthoses: Spinal Brace Spinal Brace: Thoracolumbosacral orthotic;Applied in sitting position Restrictions Weight Bearing Restrictions Per Provider Order: No     Mobility Bed Mobility Overal bed mobility: Needs Assistance Bed Mobility: Rolling,  Sidelying to Sit Rolling: Max assist, +2 for physical assistance, +2 for safety/equipment Sidelying to sit: Max assist, +2 for physical assistance, +2 for safety/equipment       General bed mobility comments: Pt requiring cues and assistane to initate, c/o of pain throughout    Transfers Overall transfer level: Needs assistance Equipment used: Rolling walker (2 wheels) Transfers: Sit to/from Stand, Bed to chair/wheelchair/BSC Sit to Stand: Max assist, +2 physical assistance, +2 safety/equipment, From elevated surface     Step pivot transfers: Mod assist, +2 physical assistance, +2 safety/equipment     General transfer comment: Max +2 assist to stand, mod assist to sequence transfer and use RW      Balance Overall balance assessment: Needs assistance Sitting-balance support: No upper extremity supported, Feet supported, Bilateral upper extremity supported Sitting balance-Leahy Scale: Poor   Postural control: Posterior lean Standing balance support: Bilateral upper extremity supported, During functional activity, Reliant on assistive device for balance Standing balance-Leahy Scale: Poor Standing balance comment: Reliant on RW and external support         ADL either performed or assessed with clinical judgement   ADL Overall ADL's : Needs assistance/impaired Eating/Feeding: Set up;Sitting   Grooming: Set up;Sitting   Upper Body Bathing: Set up;Sitting   Lower Body Bathing: Maximal assistance;Sit to/from stand;+2 for physical assistance   Upper Body Dressing : Set up;Sitting   Lower Body Dressing: Total assistance;Sit to/from stand   Toilet Transfer: Maximal assistance;+2 for physical assistance;+2 for safety/equipment;Rolling walker (2 wheels) Toilet Transfer Details (indicate cue type and reason): Step pivot to chair Toileting- Clothing Manipulation and Hygiene: Total assistance       Functional mobility during ADLs:  Maximal assistance;+2 for physical  assistance;+2 for safety/equipment;Rolling walker (2 wheels) General ADL Comments: Pt with generalized weakness and poor standing balance     Vision Baseline Vision/History: 1 Wears glasses Patient Visual Report: No change from baseline Vision Assessment?: No apparent visual deficits            Pertinent Vitals/Pain Pain Assessment Pain Assessment: Faces Faces Pain Scale: Hurts even more Pain Location: back Pain Descriptors / Indicators: Discomfort, Grimacing, Guarding Pain Intervention(s): Limited activity within patient's tolerance     Extremity/Trunk Assessment Upper Extremity Assessment Upper Extremity Assessment: Generalized weakness   Lower Extremity Assessment Lower Extremity Assessment: Defer to PT evaluation   Cervical / Trunk Assessment Cervical / Trunk Assessment: Kyphotic   Communication Communication Communication: Impaired Factors Affecting Communication: Hearing impaired   Cognition Arousal: Alert Behavior During Therapy: WFL for tasks assessed/performed Cognition: Cognition impaired   Orientation impairments: Time, Situation Awareness: Online awareness impaired Memory impairment (select all impairments): Short-term memory, Working memory Attention impairment (select first level of impairment): Sustained attention Executive functioning impairment (select all impairments): Problem solving, Reasoning OT - Cognition Comments: Poor STM and situational awareness. HOH could be impacting       Following commands: Intact       Cueing  General Comments   Cueing Techniques: Verbal cues  Family in room and supportive           Home Living Family/patient expects to be discharged to:: Private residence Living Arrangements: Spouse/significant other Available Help at Discharge: Family;Available 24 hours/day Type of Home: House Home Access: Stairs to enter [employer] of Steps: 5 Entrance Stairs-Rails: Right Home Layout: One level      Bathroom Shower/Tub: Producer, television/film/video: Standard     Home Equipment: BSC/3in1;Cane - single point;Rolling Environmental consultant (2 wheels);Shower seat;Grab bars - toilet;Grab bars - tub/shower;Hand held shower head          Prior Functioning/Environment Prior Level of Function : Independent/Modified Independent;Driving             Mobility Comments: up until 3 weeks ago walking well without device ADLs Comments: ind    OT Problem List: Decreased strength;Decreased activity tolerance;Impaired balance (sitting and/or standing);Decreased range of motion;Decreased cognition;Decreased safety awareness;Cardiopulmonary status limiting activity   OT Treatment/Interventions: Self-care/ADL training;Therapeutic exercise;Energy conservation;DME and/or AE instruction;Therapeutic activities;Patient/family education;Balance training      OT Goals(Current goals can be found in the care plan section)   Acute Rehab OT Goals Patient Stated Goal: To relax OT Goal Formulation: With patient Time For Goal Achievement: 08/09/24 Potential to Achieve Goals: Good   OT Frequency:  Min 2X/week       AM-PAC OT 6 Clicks Daily Activity     Outcome Measure Help from another person eating meals?: None Help from another person taking care of personal grooming?: A Little Help from another person toileting, which includes using toliet, bedpan, or urinal?: Total Help from another person bathing (including washing, rinsing, drying)?: A Lot Help from another person to put on and taking off regular upper body clothing?: A Little Help from another person to put on and taking off regular lower body clothing?: Total 6 Click Score: 14   End of Session Equipment Utilized During Treatment: Gait belt;Rolling walker (2 wheels) Nurse Communication: Mobility status;Need for lift equipment  Activity Tolerance: Patient tolerated treatment well Patient left: in chair;with call bell/phone within reach;with chair  alarm set;with family/visitor present  OT Visit Diagnosis: Unsteadiness on feet (R26.81);Other abnormalities of gait and mobility (R26.89);Muscle  weakness (generalized) (M62.81)                Time: 8545-8467 OT Time Calculation (min): 38 min Charges:  OT General Charges $OT Visit: 1 Visit OT Evaluation $OT Eval Moderate Complexity: 1 Mod  Adrianne BROCKS, OT  Acute Rehabilitation Services Office [phone] Secure chat preferred   Adrianne GORMAN Savers 07/26/2024, 3:57 PM

## 2024-07-26 NOTE — Hospital Course (Signed)
 43M hx aortic stenosis, CKD, DM, htn/hld, GERD.  Recently admitted to [facility] 10/14-10/16/2025 for generalized weakness and chronic back pain from comp fx.  Returning from [facility] for AMS pain not well controlled.    Assessment and Plan:   Intractable back pain with history of compression fracture  - Recent compression fracture and T12, L2, L5.  Repeat imaging on presentation showing no changes and no new compression fractures.  Patient admits to pain and weakness that is uncontrolled.  Limiting his ability to ambulate.  Interestingly he had refused TLSO brace and pain medications.  Tried to encourage these modalities to increase his participation with PT.  Ordered lidocaine  patch, as needed oxycodone , Flexeril.  Encourage participation with PT/OT.   Reported abdominal pain - Reported abdominal pain on presentation however appears to be resolved at this time.  CT abdomen pelvis negative.  UA negative.  Appears resolved.   Diabetes mellitus - Insulin sliding scale on board.   HTN/HLD - Home medications resumed.   Physical debilitation muscle weakness - Exacerbated by above.  Discussion with patient's son stating that they do not wish to return to previous facility.  PT/OT recommending STR.  Will work with [facility] and family on disposition planning.

## 2024-07-26 NOTE — Progress Notes (Signed)
 Progress Note   Patient: Harold Rogers FMW:[id number] DOB: Jul 05, 1935 DOA: 07/24/2024  DOS: the patient was seen and examined on 07/26/2024   Brief hospital course:  46M hx aortic stenosis, CKD, DM, htn/hld, GERD.  Recently admitted to [facility] 10/14-10/16/2025 for generalized weakness and chronic back pain from comp fx.  Returning from [facility] for AMS pain not well controlled.   Assessment and Plan:  History of compression fracture with intractable back pain - Recent compression fracture and T12, L2, L5.  Repeat imaging on presentation showing no changes and no new compression fractures.  Patient admits to pain and weakness that is uncontrolled.  Limiting his ability to ambulate.  Interestingly he had refused TLSO brace and pain medications.  Tried to encourage these modalities to increase his participation with PT.  Will order lidocaine  patch, as needed oxycodone , Flexeril.  Encourage participation with PT/OT.  Reported abdominal pain - Reported abdominal pain on presentation however appears to be resolved at this time.  CT abdomen pelvis negative.  UA negative.  Appears resolved.  Diabetes mellitus - Insulin sliding scale on board.  HTN/HLD - Home medications resumed.  Physical debilitation muscle weakness - Exacerbated by above.  Patient was previously at [facility] for STR.  Discussion with patient's son stating that they do not wish to return to that facility although patient himself has no opinion about the matter.  PT/OT recommending STR.  Will work with [facility] and family on disposition planning.  Subjective: Patient resting this morning.  Complaining of sinking down in the bed and not being able to walk around.  Had ordered TLSO brace yesterday however patient refused fitting.  Attempted to explain to the patient about different modalities to help control his pain to allow him to participate with physical therapy and become stronger.  His main complaint is weakness as well as low  back pain.  Currently denies any shortness of breath, chest pain, nausea, vomiting, abdominal pain.  Physical Exam:  Vitals:   07/25/24 2305 07/26/24 0346 07/26/24 0733 07/26/24 1156  BP: 100/79 (!) 96/56 (!) 102/54 100/64  Pulse: 74 70 76   Resp: 18 16 18 19   Temp: 97.6 F (36.4 C) 97.8 F (36.6 C) 98.9 F (37.2 C) 97.7 F (36.5 C)  TempSrc: Oral Oral Oral Oral  SpO2: 94% 97% 98%   Weight:      Height:        GENERAL:  Alert, pleasant, no acute distress, frail HEENT:  EOMI CARDIOVASCULAR:  RRR, no murmurs appreciated RESPIRATORY:  Clear to auscultation, no wheezing, rales, or rhonchi GASTROINTESTINAL:  Soft, nontender, nondistended EXTREMITIES:  No LE edema bilaterally NEURO:  No new focal deficits appreciated SKIN:  No rashes noted PSYCH:  Appropriate mood and affect, anxious    Data Reviewed:  Imaging Studies: ECHOCARDIOGRAM COMPLETE Result Date: 07/26/2024    ECHOCARDIOGRAM REPORT   Patient Name:   Harold Rogers Date of Exam: 07/26/2024 Medical Rec #:  [id number]     Height:       71.0 in Accession #:    [id number]    Weight:       169.8 lb Date of Birth:  09/05/1935     BSA:          1.967 m Patient Age:    89 years      BP:           102/54 mmHg Patient Gender: M             HR:  71 bpm. Exam Location:  Inpatient Procedure: 2D Echo (Both Spectral and Color Flow Doppler were utilized during            procedure). Indications:    elevated troponin  History:        Patient has no prior history of Echocardiogram examinations.                 Risk Factors:Dyslipidemia and Hypertension.  Sonographer:    Tinnie Barefoot RDCS Referring Phys: [id number] RATHORE  Sonographer Comments: Image acquisition challenging due to respiratory motion. IMPRESSIONS  1. Left ventricular ejection fraction, by estimation, is 60 to 65%. The left ventricle has normal function. The left ventricle has no regional wall motion abnormalities. There is mild left ventricular hypertrophy.  Left ventricular diastolic parameters are consistent with Grade I diastolic dysfunction (impaired relaxation).  2. Right ventricular systolic function is normal. The right ventricular size is normal. There is normal pulmonary artery systolic pressure.  3. The mitral valve is abnormal. Mild mitral valve regurgitation. No evidence of mitral stenosis.  4. Moderate aortic stenosis. AVA VTI 1.09, mean gradient 14, DI 0.38, SVI 26. Lower than expected gradient likely due to low SVI. The aortic valve has an indeterminant number of cusps. There is severe calcifcation of the aortic valve. There is severe thickening of the aortic valve. Aortic valve regurgitation is not visualized. Moderate aortic valve stenosis.  5. The inferior vena cava is normal in size with greater than 50% respiratory variability, suggesting right atrial pressure of 3 mmHg. FINDINGS  Left Ventricle: Left ventricular ejection fraction, by estimation, is 60 to 65%. The left ventricle has normal function. The left ventricle has no regional wall motion abnormalities. The left ventricular internal cavity size was normal in size. There is  mild left ventricular hypertrophy. Left ventricular diastolic parameters are consistent with Grade I diastolic dysfunction (impaired relaxation). Normal left ventricular filling pressure. Right Ventricle: The right ventricular size is normal. Right vetricular wall thickness was not well visualized. Right ventricular systolic function is normal. There is normal pulmonary artery systolic pressure. The tricuspid regurgitant velocity is 2.17 m/s, and with an assumed right atrial pressure of 8 mmHg, the estimated right ventricular systolic pressure is 26.8 mmHg. Left [facility]: Left atrial size was normal in size. Right [facility]: Right atrial size was normal in size. Pericardium: There is no evidence of pericardial effusion. Mitral Valve: The mitral valve is abnormal. Mild mitral valve regurgitation. No evidence of mitral valve  stenosis. Tricuspid Valve: The tricuspid valve is normal in structure. Tricuspid valve regurgitation is not demonstrated. No evidence of tricuspid stenosis. Aortic Valve: Moderate aortic stenosis. AVA VTI 1.09, mean gradient 14, DI 0.38, SVI 26. Lower than expected gradient likely due to low SVI. The aortic valve has an indeterminant number of cusps. There is severe calcifcation of the aortic valve. There is  severe thickening of the aortic valve. There is severe aortic valve annular calcification. Aortic valve regurgitation is not visualized. Moderate aortic stenosis is present. Aortic valve mean gradient measures 14.0 mmHg. Aortic valve peak gradient measures 30.9 mmHg. Aortic valve area, by VTI measures 1.09 cm. Pulmonic Valve: The pulmonic valve was not well visualized. Pulmonic valve regurgitation is not visualized. No evidence of pulmonic stenosis. Aorta: The aortic root and ascending aorta are structurally normal, with no evidence of dilitation. Venous: The inferior vena cava is normal in size with greater than 50% respiratory variability, suggesting right atrial pressure of 3 mmHg. IAS/Shunts: No atrial level shunt detected by  color flow Doppler.  LEFT VENTRICLE PLAX 2D LVIDd:         4.60 cm   Diastology LVIDs:         3.30 cm   LV e' medial:    4.24 cm/s LV PW:         1.10 cm   LV E/e' medial:  13.6 LV IVS:        1.10 cm   LV e' lateral:   6.96 cm/s LVOT diam:     1.90 cm   LV E/e' lateral: 8.3 LV SV:         52 LV SV Index:   26 LVOT Area:     2.84 cm  RIGHT VENTRICLE             IVC RV Basal diam:  2.90 cm     IVC diam: 2.30 cm RV S prime:     12.60 cm/s TAPSE (M-mode): 2.2 cm LEFT ATRIUM             Index        RIGHT ATRIUM           Index LA diam:        3.90 cm 1.98 cm/m   RA Area:     20.40 cm LA Vol (A2C):   66.4 ml 33.76 ml/m  RA Volume:   51.40 ml  26.14 ml/m LA Vol (A4C):   70.5 ml 35.85 ml/m LA Biplane Vol: 68.1 ml 34.63 ml/m  AORTIC VALVE AV Area (Vmax):    0.89 cm AV Area (Vmean):    0.95 cm AV Area (VTI):     1.09 cm AV Vmax:           278.00 cm/s AV Vmean:          166.000 cm/s AV VTI:            0.475 m AV Peak Grad:      30.9 mmHg AV Mean Grad:      14.0 mmHg LVOT Vmax:         87.65 cm/s LVOT Vmean:        55.800 cm/s LVOT VTI:          0.182 m LVOT/AV VTI ratio: 0.38  AORTA Ao Root diam: 3.40 cm Ao Asc diam:  3.30 cm MITRAL VALVE               TRICUSPID VALVE MV Area (PHT): 2.29 cm    TR Peak grad:   18.8 mmHg MV Decel Time: 332 msec    TR Vmax:        217.00 cm/s MV E velocity: 57.50 cm/s MV A velocity: 94.00 cm/s  SHUNTS MV E/A ratio:  0.61        Systemic VTI:  0.18 m                            Systemic Diam: 1.90 cm Dorn Ross MD Electronically signed by Dorn Ross MD Signature Date/Time: 07/26/2024/11:42:45 AM    Final    CT ABDOMEN PELVIS W CONTRAST Result Date: 07/24/2024 CLINICAL DATA:  Fatigue, rhonchi, abdominal pain EXAM: CT ANGIOGRAPHY CHEST CT ABDOMEN AND PELVIS WITH CONTRAST TECHNIQUE: Multidetector CT imaging of the chest was performed using the standard protocol during bolus administration of intravenous contrast. Multiplanar CT image reconstructions and MIPs were obtained to evaluate the vascular anatomy. Multidetector CT imaging of the abdomen and pelvis was performed using  the standard protocol during bolus administration of intravenous contrast. RADIATION DOSE REDUCTION: This exam was performed according to the departmental dose-optimization program which includes automated exposure control, adjustment of the mA and/or kV according to patient size and/or use of iterative reconstruction technique. CONTRAST:  75mL OMNIPAQUE IOHEXOL 350 MG/ML SOLN COMPARISON:  07/24/2024 FINDINGS: CTA CHEST FINDINGS Cardiovascular: This is a technically adequate evaluation of the pulmonary vasculature. No filling defects or pulmonary emboli. The heart is unremarkable without pericardial effusion. No evidence of thoracic aortic aneurysm or dissection. Atherosclerosis of  the aorta and coronary vasculature. Mediastinum/Nodes: No enlarged mediastinal, hilar, or axillary lymph nodes. Thyroid  gland, trachea, and esophagus demonstrate no significant findings. Moderate hiatal hernia. Lungs/Pleura: There are small free-flowing bilateral pleural effusions, right greater than left. Compressive atelectasis within the dependent bilateral lower lobes. No acute airspace disease or pneumothorax. Central airways are patent. Upper lobe predominant emphysema. Musculoskeletal: Chronic T3, T12, and L2 compression deformities. No acute bony abnormalities. Reconstructed images demonstrate no additional findings. Review of the MIP images confirms the above findings. CT ABDOMEN and PELVIS FINDINGS Hepatobiliary: No focal liver abnormality is seen. Status post cholecystectomy. No biliary dilatation. Pancreas: Unremarkable. No pancreatic ductal dilatation or surrounding inflammatory changes. Spleen: Normal in size without focal abnormality. Adrenals/Urinary Tract: The adrenals are unremarkable. Simple appearing bilateral renal cortical and peripelvic cysts do not require specific imaging follow-up. No obstructive uropathy within either kidney. Bladder is unremarkable. Stomach/Bowel: No bowel obstruction or ileus. Moderate hiatal hernia. Sigmoid diverticulosis without evidence of acute diverticulitis. No bowel wall thickening or inflammatory change. Minimal retained stool throughout the colon. Vascular/Lymphatic: Diffuse aortic atherosclerosis. Multifocal high-grade stenoses within the bilateral common iliac and external iliac arteries. No pathologic adenopathy. Reproductive: The prostate is enlarged. Other: No free fluid or free intraperitoneal gas. No abdominal wall hernia. Musculoskeletal: No acute or destructive bony abnormalities. Chronic compression deformities of T12, L2, and L5. Reconstructed images demonstrate no additional findings. Review of the MIP images confirms the above findings. IMPRESSION:  Chest: 1. No evidence of pulmonary embolus. 2. Small free-flowing bilateral pleural effusions and dependent lower lobe atelectasis. 3. Hiatal hernia. 4. Aortic Atherosclerosis (ICD10-I70.0) and Emphysema (ICD10-J43.9). Abdomen/pelvis: 1. Sigmoid diverticulosis without diverticulitis. 2. Enlarged prostate. 3. Aortic Atherosclerosis (ICD10-I70.0). Multifocal high-grade stenoses of the bilateral common and external iliac arteries. Electronically Signed   By: Ozell Daring M.D.   On: 07/24/2024 23:34   CT Angio Chest PE W and/or Wo Contrast Result Date: 07/24/2024 CLINICAL DATA:  Fatigue, rhonchi, abdominal pain EXAM: CT ANGIOGRAPHY CHEST CT ABDOMEN AND PELVIS WITH CONTRAST TECHNIQUE: Multidetector CT imaging of the chest was performed using the standard protocol during bolus administration of intravenous contrast. Multiplanar CT image reconstructions and MIPs were obtained to evaluate the vascular anatomy. Multidetector CT imaging of the abdomen and pelvis was performed using the standard protocol during bolus administration of intravenous contrast. RADIATION DOSE REDUCTION: This exam was performed according to the departmental dose-optimization program which includes automated exposure control, adjustment of the mA and/or kV according to patient size and/or use of iterative reconstruction technique. CONTRAST:  75mL OMNIPAQUE IOHEXOL 350 MG/ML SOLN COMPARISON:  07/24/2024 FINDINGS: CTA CHEST FINDINGS Cardiovascular: This is a technically adequate evaluation of the pulmonary vasculature. No filling defects or pulmonary emboli. The heart is unremarkable without pericardial effusion. No evidence of thoracic aortic aneurysm or dissection. Atherosclerosis of the aorta and coronary vasculature. Mediastinum/Nodes: No enlarged mediastinal, hilar, or axillary lymph nodes. Thyroid  gland, trachea, and esophagus demonstrate no significant findings. Moderate hiatal hernia. Lungs/Pleura: There  are small free-flowing bilateral  pleural effusions, right greater than left. Compressive atelectasis within the dependent bilateral lower lobes. No acute airspace disease or pneumothorax. Central airways are patent. Upper lobe predominant emphysema. Musculoskeletal: Chronic T3, T12, and L2 compression deformities. No acute bony abnormalities. Reconstructed images demonstrate no additional findings. Review of the MIP images confirms the above findings. CT ABDOMEN and PELVIS FINDINGS Hepatobiliary: No focal liver abnormality is seen. Status post cholecystectomy. No biliary dilatation. Pancreas: Unremarkable. No pancreatic ductal dilatation or surrounding inflammatory changes. Spleen: Normal in size without focal abnormality. Adrenals/Urinary Tract: The adrenals are unremarkable. Simple appearing bilateral renal cortical and peripelvic cysts do not require specific imaging follow-up. No obstructive uropathy within either kidney. Bladder is unremarkable. Stomach/Bowel: No bowel obstruction or ileus. Moderate hiatal hernia. Sigmoid diverticulosis without evidence of acute diverticulitis. No bowel wall thickening or inflammatory change. Minimal retained stool throughout the colon. Vascular/Lymphatic: Diffuse aortic atherosclerosis. Multifocal high-grade stenoses within the bilateral common iliac and external iliac arteries. No pathologic adenopathy. Reproductive: The prostate is enlarged. Other: No free fluid or free intraperitoneal gas. No abdominal wall hernia. Musculoskeletal: No acute or destructive bony abnormalities. Chronic compression deformities of T12, L2, and L5. Reconstructed images demonstrate no additional findings. Review of the MIP images confirms the above findings. IMPRESSION: Chest: 1. No evidence of pulmonary embolus. 2. Small free-flowing bilateral pleural effusions and dependent lower lobe atelectasis. 3. Hiatal hernia. 4. Aortic Atherosclerosis (ICD10-I70.0) and Emphysema (ICD10-J43.9). Abdomen/pelvis: 1. Sigmoid diverticulosis  without diverticulitis. 2. Enlarged prostate. 3. Aortic Atherosclerosis (ICD10-I70.0). Multifocal high-grade stenoses of the bilateral common and external iliac arteries. Electronically Signed   By: Ozell Daring M.D.   On: 07/24/2024 23:34   DG Chest 2 View Result Date: 07/24/2024 CLINICAL DATA:  Fatigue, rhonchi EXAM: CHEST - 2 VIEW COMPARISON:  12/09/2021 FINDINGS: Frontal and lateral views of the chest are obtained on 3 images. The evaluation is limited due to patient positioning and cooperation. Cardiac silhouette remains enlarged. Hiatal hernia again noted. There are bibasilar veiling opacities, right greater than left, consistent with effusions and underlying consolidation. No pneumothorax. IMPRESSION: 1. Bilateral pleural effusions and basilar consolidation, right greater than left. 2. Hiatal hernia. Electronically Signed   By: Ozell Daring M.D.   On: 07/24/2024 20:40    There are no new results to review at this time.  Previous records (including but not limited to H&P, progress notes, nursing notes, TOC management) were reviewed in assessment of this patient.  Labs: CBC: Recent Labs  Lab 07/24/24 2028 07/25/24 0627  WBC 7.4 7.3  NEUTROABS 6.0  --   HGB 11.6* 11.5*  HCT 35.0* 35.7*  MCV 94.3 95.5  PLT 291 312   Basic Metabolic Panel: Recent Labs  Lab 07/24/24 2028  NA 135  K 4.3  CL 104  CO2 22  GLUCOSE 156*  BUN 22  CREATININE 0.89  CALCIUM 7.8*   Liver Function Tests: Recent Labs  Lab 07/24/24 2028  AST 41  ALT 32  ALKPHOS 55  BILITOT 0.7  PROT 4.8*  ALBUMIN 1.9*   CBG: Recent Labs  Lab 07/25/24 1221 07/25/24 1644 07/25/24 2103 07/26/24 0850 07/26/24 1203  GLUCAP 157* 145* 144* 126* 252*    Scheduled Meds:  aspirin EC  81 mg Oral Daily   enoxaparin (LOVENOX) injection  40 mg Subcutaneous Q24H   ezetimibe  10 mg Oral Daily   insulin aspart  0-5 Units Subcutaneous QHS   insulin aspart  0-9 Units Subcutaneous TID WC   rosuvastatin  40 mg  Oral Daily   Continuous Infusions: PRN Meds:.acetaminophen **OR** acetaminophen, cyclobenzaprine, oxyCODONE , traMADol  Family Communication: None at bedside  Disposition: Status is: Observation The patient remains OBS appropriate and will d/c before 2 midnights.     Time spent: 35 minutes  Length of inpatient stay: 0 days  Author: Carliss LELON Canales, DO 07/26/2024 2:14 PM  For on call review [email or web address].

## 2024-07-26 NOTE — TOC Progression Note (Signed)
 Transition of Care [facility] - Progression Note    Patient Details  Name: Harold Rogers MRN: [id number] Date of Birth: 11-Nov-1934  [facility] CM/SW Contact  Robynn Eileen Hoose, RN Phone Number: 07/26/2024, 1:07 PM  Clinical Narrative:   Patient unavailable to do MOON letter.                      Expected Discharge Plan and Services                                               Social Drivers of Health (SDOH) Interventions SDOH Screenings   Food Insecurity: No Food Insecurity (07/25/2024)  Housing: Unknown (07/25/2024)  Transportation Needs: No Transportation Needs (07/25/2024)  Utilities: Not At Risk (07/25/2024)  Social Connections: Moderately Integrated (07/25/2024)  Tobacco Use: Medium Risk (07/25/2024)    Readmission Risk Interventions     No data to display

## 2024-07-26 NOTE — Plan of Care (Signed)
  Problem: Fluid Volume: Goal: Ability to maintain a balanced intake and output will improve Outcome: Progressing   Problem: Metabolic: Goal: Ability to maintain appropriate glucose levels will improve Outcome: Progressing   Problem: Tissue Perfusion: Goal: Adequacy of tissue perfusion will improve Outcome: Progressing   Problem: Health Behavior/Discharge Planning: Goal: Ability to manage health-related needs will improve Outcome: Progressing

## 2024-07-26 NOTE — TOC CM/SW Note (Addendum)
 Transition of Care (TOC) CM/SW Note   CSW contacted pt's wife Dagoberto, as pt was unable to speak with CSW, briefly spoke with her before pt's son Todd was put on the phone. He said that pt was at [facility] for rehab and does not want pt to return there at [facility]. He requested CSW fax out pt to other facilities in [place] or [address] area. Preference for [facility]. CSW to fax out and provide bed offers when available.   Also stated that if pt could manage discharge home with home health, would be a preference.They said they would make a determination once they receive bed offers and will update Inpatient case management team from there.  Son Todd provided contact number of [phone] with permission from pt to reach out to for assist with disposition planning.   CSW will continue to follow.         Patient Goals and CMS Choice        Expected Discharge Plan and Services                                                Prior Living Arrangements/Services                       Activities of Daily Living   ADL Screening (condition at time of admission) Independently performs ADLs?: Yes (appropriate for developmental age) Is the patient deaf or have difficulty hearing?: Yes Does the patient have difficulty seeing, even when wearing glasses/contacts?: No Does the patient have difficulty concentrating, remembering, or making decisions?: No  Permission Sought/Granted                  Emotional Assessment              Admission diagnosis:  Pleural effusion [J90] Elevated troponin [R79.89] Opacities of both lungs present on chest x-ray [R91.8] Community acquired pneumonia, unspecified laterality [J18.9] Patient Active Problem List   Diagnosis Date Noted   Lumbar compression fracture (HCC) 07/26/2024   Intractable back pain 07/26/2024   Elevated troponin 07/25/2024   Abdominal pain 07/25/2024   Normocytic anemia 07/25/2024   Pleural  effusion 07/25/2024   Generalized weakness 07/25/2024   Cerebrovascular accident (CVA) (HCC) 04/15/2018   Hyperlipidemia 03/22/2018   Aftercare following surgery 02/14/2018   Paronychia of fourth toe of right foot 01/31/2018   Acid reflux 09/24/2017   Hypertension 09/24/2017   Prominent metatarsal head, right 09/19/2017   Primary osteoarthritis of right knee 07/12/2016   Hammer toes, bilateral 07/06/2016   Pre-ulcerative corn or callous 07/06/2016   PCP:  Ofilia Lamar CROME, MD Pharmacy:   [facility] [id number] - [place], [place] - [address] [facility] [place] [address] Phone: [phone] Fax: [phone]  [facility] [place], [place] - [address] [place] [address] Phone: [phone] Fax: [phone]     Social Determinants of Health (SDOH) Interventions    Readmission Risk Interventions     No data to display

## 2024-07-27 DIAGNOSIS — S32000S Wedge compression fracture of unspecified lumbar vertebra, sequela: Secondary | ICD-10-CM | POA: Diagnosis not present

## 2024-07-27 DIAGNOSIS — M549 Dorsalgia, unspecified: Secondary | ICD-10-CM | POA: Diagnosis not present

## 2024-07-27 LAB — BASIC METABOLIC PANEL WITH GFR
Anion gap: 8 (ref 5–15)
BUN: 21 mg/dL (ref 8–23)
CO2: 22 mmol/L (ref 22–32)
Calcium: 7.6 mg/dL — ABNORMAL LOW (ref 8.9–10.3)
Chloride: 102 mmol/L (ref 98–111)
Creatinine, Ser: 1 mg/dL (ref 0.61–1.24)
GFR, Estimated: >60 mL/min (ref >60)
Glucose, Bld: 136 mg/dL — ABNORMAL HIGH (ref 70–99)
Potassium: 3.9 mmol/L (ref 3.5–5.1)
Sodium: 132 mmol/L — ABNORMAL LOW (ref 135–145)

## 2024-07-27 LAB — CBC
HCT: 34.2 % — ABNORMAL LOW (ref 39.0–52.0)
Hemoglobin: 11 g/dL — ABNORMAL LOW (ref 13.0–17.0)
MCH: 30.1 pg (ref 26.0–34.0)
MCHC: 32.2 g/dL (ref 30.0–36.0)
MCV: 93.4 fL (ref 80.0–100.0)
Platelets: 376 K/uL (ref 150–400)
RBC: 3.66 MIL/uL — ABNORMAL LOW (ref 4.22–5.81)
RDW: 13.8 % (ref 11.5–15.5)
WBC: 7.7 K/uL (ref 4.0–10.5)
nRBC: 0 % (ref 0.0–0.2)

## 2024-07-27 LAB — GLUCOSE, CAPILLARY
Glucose-Capillary: 139 mg/dL — ABNORMAL HIGH (ref 70–99)
Glucose-Capillary: 165 mg/dL — ABNORMAL HIGH (ref 70–99)
Glucose-Capillary: 174 mg/dL — ABNORMAL HIGH (ref 70–99)
Glucose-Capillary: 165 mg/dL — ABNORMAL HIGH (ref 70–99)

## 2024-07-27 LAB — MAGNESIUM: Magnesium: 1.9 mg/dL (ref 1.7–2.4)

## 2024-07-27 MED ORDER — BISACODYL 5 MG PO TBEC: 5.0000 mg | DELAYED_RELEASE_TABLET | Freq: Every day | ORAL | Status: DC | PRN

## 2024-07-27 MED ORDER — POLYETHYLENE GLYCOL 3350 17 G PO PACK
17.0000 g | PACK | Freq: Every day | ORAL | Status: DC
  Administered 2024-07-28 – 2024-07-29 (×2): 17 g via ORAL
  Filled 2024-07-27 (×2): qty 1

## 2024-07-27 MED ORDER — ALUM & MAG HYDROXIDE-SIMETH 200-200-20 MG/5ML PO SUSP
30.0000 mL | Freq: Four times a day (QID) | ORAL | Status: DC | PRN
  Administered 2024-07-27: 30 mL via ORAL
  Filled 2024-07-27: qty 30

## 2024-07-27 MED ORDER — BISACODYL 10 MG RE SUPP
10.0000 mg | Freq: Every day | RECTAL | Status: DC | PRN
Start: 2024-07-27 — End: 2024-07-29

## 2024-07-27 NOTE — Progress Notes (Signed)
 Progress Note   Patient: Harold Rogers FMW:[id number] DOB: December 17, 1934 DOA: 07/24/2024  DOS: the patient was seen and examined on 07/27/2024   Brief hospital course:  60M hx aortic stenosis, CKD, DM, htn/hld, GERD.  Recently admitted to [facility] 10/14-10/16/2025 for generalized weakness and chronic back pain from comp fx.  Returning from [facility] for AMS pain not well controlled.    Assessment and Plan:   Intractable back pain with history of compression fracture  - Recent compression fracture and T12, L2, L5.  Repeat imaging on presentation showing no changes and no new compression fractures.  Patient admits to pain and weakness that is uncontrolled.  Limiting his ability to ambulate.  Interestingly he had refused TLSO brace and pain medications.  Tried to encourage these modalities to increase his participation with PT.  Ordered lidocaine  patch, as needed oxycodone , Flexeril.  Encourage participation with PT/OT.   Reported abdominal pain - Reported abdominal pain on presentation however appears to be resolved at this time.  CT abdomen pelvis negative.  UA negative.  Appears resolved.   Diabetes mellitus - Insulin sliding scale on board.   HTN/HLD - Home medications resumed.   Physical debilitation muscle weakness - Exacerbated by above.  Discussion with patient's son stating that they do not wish to return to previous facility.  PT/OT recommending STR.  Will work with [facility] and family on disposition planning.   Subjective: Patient resting comfortably, family at bedside.  Patient states he has been feeling improved, sitting in bedside chair, participating with PT.  Denies any fever, shortness of breath, chest pain, nausea, vomiting, abdominal pain, lower extremity swelling.  Was able to update both patient's sons, and his current status and upcoming disposition planning.  Questions answered and concerns addressed.  Physical Exam:  Vitals:   07/27/24 0050 07/27/24 0430 07/27/24 0756  07/27/24 1134  BP: (!) 112/58 98/62 101/64   Pulse:   72   Resp:  17 16   Temp:  98 F (36.7 C) 98.3 F (36.8 C) 97.8 F (36.6 C)  TempSrc:  Oral Oral Oral  SpO2:  98% 91%   Weight:      Height:        GENERAL:  Alert, pleasant, no acute distress, frail HEENT:  EOMI CARDIOVASCULAR:  RRR, no murmurs appreciated RESPIRATORY:  Clear to auscultation, no wheezing, rales, or rhonchi GASTROINTESTINAL:  Soft, nontender, nondistended EXTREMITIES:  No LE edema bilaterally NEURO:  No new focal deficits appreciated SKIN:  No rashes noted PSYCH:  Appropriate mood and affect, anxious   Data Reviewed:  Imaging Studies: ECHOCARDIOGRAM COMPLETE Result Date: 07/26/2024    ECHOCARDIOGRAM REPORT   Patient Name:   ALVAN CULPEPPER Date of Exam: 07/26/2024 Medical Rec #:  [id number]     Height:       71.0 in Accession #:    [id number]    Weight:       169.8 lb Date of Birth:  16-Feb-1935     BSA:          1.967 m Patient Age:    89 years      BP:           102/54 mmHg Patient Gender: M             HR:           71 bpm. Exam Location:  Inpatient Procedure: 2D Echo (Both Spectral and Color Flow Doppler were utilized during  procedure). Indications:    elevated troponin  History:        Patient has no prior history of Echocardiogram examinations.                 Risk Factors:Dyslipidemia and Hypertension.  Sonographer:    Tinnie Barefoot RDCS Referring Phys: [id number] RATHORE  Sonographer Comments: Image acquisition challenging due to respiratory motion. IMPRESSIONS  1. Left ventricular ejection fraction, by estimation, is 60 to 65%. The left ventricle has normal function. The left ventricle has no regional wall motion abnormalities. There is mild left ventricular hypertrophy. Left ventricular diastolic parameters are consistent with Grade I diastolic dysfunction (impaired relaxation).  2. Right ventricular systolic function is normal. The right ventricular size is normal. There is normal  pulmonary artery systolic pressure.  3. The mitral valve is abnormal. Mild mitral valve regurgitation. No evidence of mitral stenosis.  4. Moderate aortic stenosis. AVA VTI 1.09, mean gradient 14, DI 0.38, SVI 26. Lower than expected gradient likely due to low SVI. The aortic valve has an indeterminant number of cusps. There is severe calcifcation of the aortic valve. There is severe thickening of the aortic valve. Aortic valve regurgitation is not visualized. Moderate aortic valve stenosis.  5. The inferior vena cava is normal in size with greater than 50% respiratory variability, suggesting right atrial pressure of 3 mmHg. FINDINGS  Left Ventricle: Left ventricular ejection fraction, by estimation, is 60 to 65%. The left ventricle has normal function. The left ventricle has no regional wall motion abnormalities. The left ventricular internal cavity size was normal in size. There is  mild left ventricular hypertrophy. Left ventricular diastolic parameters are consistent with Grade I diastolic dysfunction (impaired relaxation). Normal left ventricular filling pressure. Right Ventricle: The right ventricular size is normal. Right vetricular wall thickness was not well visualized. Right ventricular systolic function is normal. There is normal pulmonary artery systolic pressure. The tricuspid regurgitant velocity is 2.17 m/s, and with an assumed right atrial pressure of 8 mmHg, the estimated right ventricular systolic pressure is 26.8 mmHg. Left [facility]: Left atrial size was normal in size. Right [facility]: Right atrial size was normal in size. Pericardium: There is no evidence of pericardial effusion. Mitral Valve: The mitral valve is abnormal. Mild mitral valve regurgitation. No evidence of mitral valve stenosis. Tricuspid Valve: The tricuspid valve is normal in structure. Tricuspid valve regurgitation is not demonstrated. No evidence of tricuspid stenosis. Aortic Valve: Moderate aortic stenosis. AVA VTI 1.09, mean  gradient 14, DI 0.38, SVI 26. Lower than expected gradient likely due to low SVI. The aortic valve has an indeterminant number of cusps. There is severe calcifcation of the aortic valve. There is  severe thickening of the aortic valve. There is severe aortic valve annular calcification. Aortic valve regurgitation is not visualized. Moderate aortic stenosis is present. Aortic valve mean gradient measures 14.0 mmHg. Aortic valve peak gradient measures 30.9 mmHg. Aortic valve area, by VTI measures 1.09 cm. Pulmonic Valve: The pulmonic valve was not well visualized. Pulmonic valve regurgitation is not visualized. No evidence of pulmonic stenosis. Aorta: The aortic root and ascending aorta are structurally normal, with no evidence of dilitation. Venous: The inferior vena cava is normal in size with greater than 50% respiratory variability, suggesting right atrial pressure of 3 mmHg. IAS/Shunts: No atrial level shunt detected by color flow Doppler.  LEFT VENTRICLE PLAX 2D LVIDd:         4.60 cm   Diastology LVIDs:  3.30 cm   LV e' medial:    4.24 cm/s LV PW:         1.10 cm   LV E/e' medial:  13.6 LV IVS:        1.10 cm   LV e' lateral:   6.96 cm/s LVOT diam:     1.90 cm   LV E/e' lateral: 8.3 LV SV:         52 LV SV Index:   26 LVOT Area:     2.84 cm  RIGHT VENTRICLE             IVC RV Basal diam:  2.90 cm     IVC diam: 2.30 cm RV S prime:     12.60 cm/s TAPSE (M-mode): 2.2 cm LEFT ATRIUM             Index        RIGHT ATRIUM           Index LA diam:        3.90 cm 1.98 cm/m   RA Area:     20.40 cm LA Vol (A2C):   66.4 ml 33.76 ml/m  RA Volume:   51.40 ml  26.14 ml/m LA Vol (A4C):   70.5 ml 35.85 ml/m LA Biplane Vol: 68.1 ml 34.63 ml/m  AORTIC VALVE AV Area (Vmax):    0.89 cm AV Area (Vmean):   0.95 cm AV Area (VTI):     1.09 cm AV Vmax:           278.00 cm/s AV Vmean:          166.000 cm/s AV VTI:            0.475 m AV Peak Grad:      30.9 mmHg AV Mean Grad:      14.0 mmHg LVOT Vmax:         87.65 cm/s  LVOT Vmean:        55.800 cm/s LVOT VTI:          0.182 m LVOT/AV VTI ratio: 0.38  AORTA Ao Root diam: 3.40 cm Ao Asc diam:  3.30 cm MITRAL VALVE               TRICUSPID VALVE MV Area (PHT): 2.29 cm    TR Peak grad:   18.8 mmHg MV Decel Time: 332 msec    TR Vmax:        217.00 cm/s MV E velocity: 57.50 cm/s MV A velocity: 94.00 cm/s  SHUNTS MV E/A ratio:  0.61        Systemic VTI:  0.18 m                            Systemic Diam: 1.90 cm Dorn Ross MD Electronically signed by Dorn Ross MD Signature Date/Time: 07/26/2024/11:42:45 AM    Final    CT ABDOMEN PELVIS W CONTRAST Result Date: 07/24/2024 CLINICAL DATA:  Fatigue, rhonchi, abdominal pain EXAM: CT ANGIOGRAPHY CHEST CT ABDOMEN AND PELVIS WITH CONTRAST TECHNIQUE: Multidetector CT imaging of the chest was performed using the standard protocol during bolus administration of intravenous contrast. Multiplanar CT image reconstructions and MIPs were obtained to evaluate the vascular anatomy. Multidetector CT imaging of the abdomen and pelvis was performed using the standard protocol during bolus administration of intravenous contrast. RADIATION DOSE REDUCTION: This exam was performed according to the departmental dose-optimization program which includes automated exposure control, adjustment of the mA  and/or kV according to patient size and/or use of iterative reconstruction technique. CONTRAST:  75mL OMNIPAQUE IOHEXOL 350 MG/ML SOLN COMPARISON:  07/24/2024 FINDINGS: CTA CHEST FINDINGS Cardiovascular: This is a technically adequate evaluation of the pulmonary vasculature. No filling defects or pulmonary emboli. The heart is unremarkable without pericardial effusion. No evidence of thoracic aortic aneurysm or dissection. Atherosclerosis of the aorta and coronary vasculature. Mediastinum/Nodes: No enlarged mediastinal, hilar, or axillary lymph nodes. Thyroid  gland, trachea, and esophagus demonstrate no significant findings. Moderate hiatal hernia.  Lungs/Pleura: There are small free-flowing bilateral pleural effusions, right greater than left. Compressive atelectasis within the dependent bilateral lower lobes. No acute airspace disease or pneumothorax. Central airways are patent. Upper lobe predominant emphysema. Musculoskeletal: Chronic T3, T12, and L2 compression deformities. No acute bony abnormalities. Reconstructed images demonstrate no additional findings. Review of the MIP images confirms the above findings. CT ABDOMEN and PELVIS FINDINGS Hepatobiliary: No focal liver abnormality is seen. Status post cholecystectomy. No biliary dilatation. Pancreas: Unremarkable. No pancreatic ductal dilatation or surrounding inflammatory changes. Spleen: Normal in size without focal abnormality. Adrenals/Urinary Tract: The adrenals are unremarkable. Simple appearing bilateral renal cortical and peripelvic cysts do not require specific imaging follow-up. No obstructive uropathy within either kidney. Bladder is unremarkable. Stomach/Bowel: No bowel obstruction or ileus. Moderate hiatal hernia. Sigmoid diverticulosis without evidence of acute diverticulitis. No bowel wall thickening or inflammatory change. Minimal retained stool throughout the colon. Vascular/Lymphatic: Diffuse aortic atherosclerosis. Multifocal high-grade stenoses within the bilateral common iliac and external iliac arteries. No pathologic adenopathy. Reproductive: The prostate is enlarged. Other: No free fluid or free intraperitoneal gas. No abdominal wall hernia. Musculoskeletal: No acute or destructive bony abnormalities. Chronic compression deformities of T12, L2, and L5. Reconstructed images demonstrate no additional findings. Review of the MIP images confirms the above findings. IMPRESSION: Chest: 1. No evidence of pulmonary embolus. 2. Small free-flowing bilateral pleural effusions and dependent lower lobe atelectasis. 3. Hiatal hernia. 4. Aortic Atherosclerosis (ICD10-I70.0) and Emphysema  (ICD10-J43.9). Abdomen/pelvis: 1. Sigmoid diverticulosis without diverticulitis. 2. Enlarged prostate. 3. Aortic Atherosclerosis (ICD10-I70.0). Multifocal high-grade stenoses of the bilateral common and external iliac arteries. Electronically Signed   By: Ozell Daring M.D.   On: 07/24/2024 23:34   CT Angio Chest PE W and/or Wo Contrast Result Date: 07/24/2024 CLINICAL DATA:  Fatigue, rhonchi, abdominal pain EXAM: CT ANGIOGRAPHY CHEST CT ABDOMEN AND PELVIS WITH CONTRAST TECHNIQUE: Multidetector CT imaging of the chest was performed using the standard protocol during bolus administration of intravenous contrast. Multiplanar CT image reconstructions and MIPs were obtained to evaluate the vascular anatomy. Multidetector CT imaging of the abdomen and pelvis was performed using the standard protocol during bolus administration of intravenous contrast. RADIATION DOSE REDUCTION: This exam was performed according to the departmental dose-optimization program which includes automated exposure control, adjustment of the mA and/or kV according to patient size and/or use of iterative reconstruction technique. CONTRAST:  75mL OMNIPAQUE IOHEXOL 350 MG/ML SOLN COMPARISON:  07/24/2024 FINDINGS: CTA CHEST FINDINGS Cardiovascular: This is a technically adequate evaluation of the pulmonary vasculature. No filling defects or pulmonary emboli. The heart is unremarkable without pericardial effusion. No evidence of thoracic aortic aneurysm or dissection. Atherosclerosis of the aorta and coronary vasculature. Mediastinum/Nodes: No enlarged mediastinal, hilar, or axillary lymph nodes. Thyroid  gland, trachea, and esophagus demonstrate no significant findings. Moderate hiatal hernia. Lungs/Pleura: There are small free-flowing bilateral pleural effusions, right greater than left. Compressive atelectasis within the dependent bilateral lower lobes. No acute airspace disease or pneumothorax. Central airways are patent. Upper lobe predominant  emphysema. Musculoskeletal: Chronic T3, T12, and L2 compression deformities. No acute bony abnormalities. Reconstructed images demonstrate no additional findings. Review of the MIP images confirms the above findings. CT ABDOMEN and PELVIS FINDINGS Hepatobiliary: No focal liver abnormality is seen. Status post cholecystectomy. No biliary dilatation. Pancreas: Unremarkable. No pancreatic ductal dilatation or surrounding inflammatory changes. Spleen: Normal in size without focal abnormality. Adrenals/Urinary Tract: The adrenals are unremarkable. Simple appearing bilateral renal cortical and peripelvic cysts do not require specific imaging follow-up. No obstructive uropathy within either kidney. Bladder is unremarkable. Stomach/Bowel: No bowel obstruction or ileus. Moderate hiatal hernia. Sigmoid diverticulosis without evidence of acute diverticulitis. No bowel wall thickening or inflammatory change. Minimal retained stool throughout the colon. Vascular/Lymphatic: Diffuse aortic atherosclerosis. Multifocal high-grade stenoses within the bilateral common iliac and external iliac arteries. No pathologic adenopathy. Reproductive: The prostate is enlarged. Other: No free fluid or free intraperitoneal gas. No abdominal wall hernia. Musculoskeletal: No acute or destructive bony abnormalities. Chronic compression deformities of T12, L2, and L5. Reconstructed images demonstrate no additional findings. Review of the MIP images confirms the above findings. IMPRESSION: Chest: 1. No evidence of pulmonary embolus. 2. Small free-flowing bilateral pleural effusions and dependent lower lobe atelectasis. 3. Hiatal hernia. 4. Aortic Atherosclerosis (ICD10-I70.0) and Emphysema (ICD10-J43.9). Abdomen/pelvis: 1. Sigmoid diverticulosis without diverticulitis. 2. Enlarged prostate. 3. Aortic Atherosclerosis (ICD10-I70.0). Multifocal high-grade stenoses of the bilateral common and external iliac arteries. Electronically Signed   By: Ozell Daring M.D.   On: 07/24/2024 23:34   DG Chest 2 View Result Date: 07/24/2024 CLINICAL DATA:  Fatigue, rhonchi EXAM: CHEST - 2 VIEW COMPARISON:  12/09/2021 FINDINGS: Frontal and lateral views of the chest are obtained on 3 images. The evaluation is limited due to patient positioning and cooperation. Cardiac silhouette remains enlarged. Hiatal hernia again noted. There are bibasilar veiling opacities, right greater than left, consistent with effusions and underlying consolidation. No pneumothorax. IMPRESSION: 1. Bilateral pleural effusions and basilar consolidation, right greater than left. 2. Hiatal hernia. Electronically Signed   By: Ozell Daring M.D.   On: 07/24/2024 20:40    There are no new results to review at this time.  Previous records (including but not limited to H&P, progress notes, nursing notes, TOC management) were reviewed in assessment of this patient.  Labs: CBC: Recent Labs  Lab 07/24/24 2028 07/25/24 0627 07/27/24 0401  WBC 7.4 7.3 7.7  NEUTROABS 6.0  --   --   HGB 11.6* 11.5* 11.0*  HCT 35.0* 35.7* 34.2*  MCV 94.3 95.5 93.4  PLT 291 312 376   Basic Metabolic Panel: Recent Labs  Lab 07/24/24 2028 07/27/24 0401  NA 135 132*  K 4.3 3.9  CL 104 102  CO2 22 22  GLUCOSE 156* 136*  BUN 22 21  CREATININE 0.89 1.00  CALCIUM 7.8* 7.6*  MG  --  1.9   Liver Function Tests: Recent Labs  Lab 07/24/24 2028  AST 41  ALT 32  ALKPHOS 55  BILITOT 0.7  PROT 4.8*  ALBUMIN 1.9*   CBG: Recent Labs  Lab 07/26/24 1203 07/26/24 1612 07/26/24 2043 07/27/24 0754 07/27/24 1131  GLUCAP 252* 138* 204* 139* 165*    Scheduled Meds:  aspirin EC  81 mg Oral Daily   enoxaparin (LOVENOX) injection  40 mg Subcutaneous Q24H   ezetimibe  10 mg Oral Daily   insulin aspart  0-5 Units Subcutaneous QHS   insulin aspart  0-9 Units Subcutaneous TID WC   lidocaine   1 patch Transdermal Q24H  rosuvastatin  40 mg Oral Daily   Continuous Infusions: PRN Meds:.acetaminophen  **OR** acetaminophen, cyclobenzaprine, oxyCODONE , traMADol  Family Communication: At bedside  Disposition: Status is: Observation The patient remains OBS appropriate and will d/c before 2 midnights.     Time spent: 33 minutes  Length of inpatient stay: 0 days  Author: Carliss LELON Canales, DO 07/27/2024 1:48 PM  For on call review [email or web address].

## 2024-07-27 NOTE — Progress Notes (Signed)
 Orthopedic Tech Progress Note Patient Details:  Harold Rogers 02/25/1935 [id number]  Ortho Devices Type of Ortho Device: Thoracolumbar corset (TLSO) Ortho Device/Splint Location: BACK Ortho Device/Splint Interventions: Ordered, Application, Adjustment, Removalfitted patient with back brace with family at bedside    Post Interventions Patient Tolerated: Well Instructions Provided: Care of device  Delanna LITTIE Pac 07/27/2024, 3:28 PM

## 2024-07-27 NOTE — NC FL2 (Signed)
 [place]  MEDICAID [id number] LEVEL OF CARE FORM     IDENTIFICATION  Patient Name: Harold Rogers Birthdate: 06-Mar-1935 Sex: male Admission Date (Current Location): 07/24/2024  [facility] and [place] Number:  Producer, television/film/video and Address:  [facility], [address] [place], [place] [address]      Provider Number: [id number]  Attending Physician Name and Address:  Arlon Carliss ORN, DO  Relative Name and Phone Number:  Aldo Sondgeroth (spouse) [phone]    Current Level of Care: Hospital Recommended Level of Care: Skilled Nursing Facility Prior Approval Number:    Date Approved/Denied:   PASRR Number: [id number]  Discharge Plan: SNF    Current Diagnoses: Patient Active Problem List   Diagnosis Date Noted   Lumbar compression fracture (HCC) 07/26/2024   Intractable back pain 07/26/2024   Elevated troponin 07/25/2024   Abdominal pain 07/25/2024   Normocytic anemia 07/25/2024   Pleural effusion 07/25/2024   Generalized weakness 07/25/2024   Cerebrovascular accident (CVA) (HCC) 04/15/2018   Hyperlipidemia 03/22/2018   Aftercare following surgery 02/14/2018   Paronychia of fourth toe of right foot 01/31/2018   Acid reflux 09/24/2017   Hypertension 09/24/2017   Prominent metatarsal head, right 09/19/2017   Primary osteoarthritis of right knee 07/12/2016   Hammer toes, bilateral 07/06/2016   Pre-ulcerative corn or callous 07/06/2016    Orientation RESPIRATION BLADDER Height & Weight     Self, Place, Situation  Normal Continent Weight: 169 lb 12.1 oz (77 kg) Height:  5' 11 (180.3 cm)  BEHAVIORAL SYMPTOMS/MOOD NEUROLOGICAL BOWEL NUTRITION STATUS      Continent Diet (Please see discharge summary)  AMBULATORY STATUS COMMUNICATION OF NEEDS Skin   Extensive Assist Verbally Other (Comment) (Ecchymosis,arm,Bil.,Lower,Erythema,scrotum,sacrum,Bil.,Lower,scratch marks,Foot,Bil.Wound/Incision LDAs)                       Personal Care Assistance  Level of Assistance  Bathing, Feeding, Dressing Bathing Assistance: Maximum assistance Feeding assistance: Independent Dressing Assistance: Maximum assistance     Functional Limitations Info  Hearing, Speech   Hearing Info: Impaired Speech Info: Adequate    SPECIAL CARE FACTORS FREQUENCY  PT (By licensed PT), OT (By licensed OT)     PT Frequency: 5x min weekly OT Frequency: 5x min weekly            Contractures Contractures Info: Not present    Additional Factors Info  Code Status, Allergies, Insulin Sliding Scale Code Status Info: DNR Allergies Info: NKA   Insulin Sliding Scale Info: insulin aspart (novoLOG) injection 0-5 Units daily at bedtime,  insulin aspart (novoLOG) injection 0-9 Units 3 times daily with meals       Current Medications (07/27/2024):  This is the current hospital active medication list Current Facility-Administered Medications  Medication Dose Route Frequency Provider Last Rate Last Admin   acetaminophen (TYLENOL) tablet 650 mg  650 mg Oral Q6H PRN Rathore, Vasundhra, MD   650 mg at 07/25/24 1230   Or   acetaminophen (TYLENOL) suppository 650 mg  650 mg Rectal Q6H PRN Rathore, Vasundhra, MD       aspirin EC tablet 81 mg  81 mg Oral Daily Rathore, Vasundhra, MD   81 mg at 07/27/24 0810   cyclobenzaprine (FLEXERIL) tablet 5 mg  5 mg Oral TID PRN Arlon Carliss ORN, DO   5 mg at 07/25/24 1230   enoxaparin (LOVENOX) injection 40 mg  40 mg Subcutaneous Q24H Alfornia Madison, MD   40 mg at 07/27/24 [phone]  ezetimibe (ZETIA) tablet 10 mg  10 mg Oral Daily Rathore, Vasundhra, MD   10 mg at 07/27/24 0810   insulin aspart (novoLOG) injection 0-5 Units  0-5 Units Subcutaneous QHS Rathore, Vasundhra, MD   2 Units at 07/26/24 2140   insulin aspart (novoLOG) injection 0-9 Units  0-9 Units Subcutaneous TID WC Rathore, Vasundhra, MD   1 Units at 07/27/24 0809   lidocaine  (LIDODERM ) 5 % 1 patch  1 patch Transdermal Q24H Arlon Carliss ORN, DO   1 patch at 07/26/24 1615    oxyCODONE  (Oxy IR/ROXICODONE ) immediate release tablet 5 mg  5 mg Oral Q4H PRN Arlon Carliss ORN, DO   5 mg at 07/25/24 1230   rosuvastatin (CRESTOR) tablet 40 mg  40 mg Oral Daily Rathore, Vasundhra, MD   40 mg at 07/27/24 0810   traMADol (ULTRAM) tablet 50 mg  50 mg Oral Q12H PRN Arlon Carliss ORN, DO         Discharge Medications: Please see discharge summary for a list of discharge medications.  Relevant Imaging Results:  Relevant Lab Results:   Additional Information [id number]  Isaiah Public, LCSWA

## 2024-07-27 NOTE — Care Management Obs Status (Signed)
 MEDICARE OBSERVATION STATUS NOTIFICATION   Patient Details  Name: Harold Rogers MRN: [id number] Date of Birth: 07-Sep-1935   Medicare Observation Status Notification Given:  Yes    Vonzell Arrie Sharps 07/27/2024, 9:14 AM

## 2024-07-27 NOTE — TOC Progression Note (Addendum)
 Transition of Care [facility]) - Progression Note    Patient Details  Name: Harold Rogers MRN: [id number] Date of Birth: [birth year]  Transition of Care [facility]) CM/SW Contact  Isaiah Public, LCSWA Phone Number: 07/27/2024, 11:28 AM  Clinical Narrative:     CSW faxed patient out for SNF. CSW awaiting SNF bed offers. CSW will continue to follow.   Update- CSW LVM for patients son Todd. CSW awaiting call back to discuss patients SNF bed offers.  Update- CSW received call back from patients son Todd and provided SNF bed offers. Patients son is going to give CSW a call back with SNF choice.  Update- Patients son inquired about [facility]. Whitney with [facility] is reviewing referral and will get back with CSW on if they can make SNF bed offer for patient. CSW will continue to follow.                   Expected Discharge Plan and Services                                               Social Drivers of Health (SDOH) Interventions SDOH Screenings   Food Insecurity: No Food Insecurity (07/25/2024)  Housing: Unknown (07/25/2024)  Transportation Needs: No Transportation Needs (07/25/2024)  Utilities: Not At Risk (07/25/2024)  Social Connections: Moderately Integrated (07/25/2024)  Tobacco Use: Medium Risk (07/25/2024)    Readmission Risk Interventions     No data to display

## 2024-07-28 DIAGNOSIS — R531 Weakness: Secondary | ICD-10-CM | POA: Diagnosis not present

## 2024-07-28 DIAGNOSIS — M549 Dorsalgia, unspecified: Secondary | ICD-10-CM | POA: Diagnosis not present

## 2024-07-28 DIAGNOSIS — S32000S Wedge compression fracture of unspecified lumbar vertebra, sequela: Secondary | ICD-10-CM | POA: Diagnosis not present

## 2024-07-28 LAB — GLUCOSE, CAPILLARY
Glucose-Capillary: 127 mg/dL — ABNORMAL HIGH (ref 70–99)
Glucose-Capillary: 163 mg/dL — ABNORMAL HIGH (ref 70–99)
Glucose-Capillary: 133 mg/dL — ABNORMAL HIGH (ref 70–99)
Glucose-Capillary: 225 mg/dL — ABNORMAL HIGH (ref 70–99)

## 2024-07-28 MED ORDER — FLEET ENEMA RE ENEM: 1.0000 | ENEMA | Freq: Every day | RECTAL | Status: DC | PRN

## 2024-07-28 MED ORDER — FUROSEMIDE 10 MG/ML IJ SOLN
40.0000 mg | Freq: Once | INTRAMUSCULAR | Status: AC
  Administered 2024-07-28: 40 mg via INTRAVENOUS
  Filled 2024-07-28: qty 4

## 2024-07-28 NOTE — Plan of Care (Signed)
  Problem: Metabolic: Goal: Ability to maintain appropriate glucose levels will improve Outcome: Progressing   Problem: Health Behavior/Discharge Planning: Goal: Ability to manage health-related needs will improve Outcome: Progressing   Problem: Skin Integrity: Goal: Risk for impaired skin integrity will decrease Outcome: Progressing   Problem: Education: Goal: Knowledge of General Education information will improve Description: Including pain rating scale, medication(s)/side effects and non-pharmacologic comfort measures Outcome: Progressing   Problem: Skin Integrity: Goal: Risk for impaired skin integrity will decrease Outcome: Progressing   Problem: Safety: Goal: Ability to remain free from injury will improve Outcome: Progressing

## 2024-07-28 NOTE — Progress Notes (Signed)
 Physical Therapy Treatment Patient Details Name: Harold Rogers MRN: [id number] DOB: 1935-10-05 Today's Date: 07/28/2024   History of Present Illness Olga Toto is a 89 y.o. male admitted 10/17 with abdominal pain and elevated troponin and decline at SNF.   Has compression fxs and TLSO was ordered. PMH: aortic stenosis, CKD, type 2 diabetes, hypertension, hyperlipidemia, GERD.  Recently admitted to [facility] 10/14-10/16/2025 for generalized weakness and chronic back pain in the setting of history of known compression fracture.  Patient was found to have dehydration and bacteriuria and sent to SNF10/16.    PT Comments  The pt was agreeable to session with focus on progressing activity tolerance, strength, balance, and gait progression. He presents with significantly lower pain levels, and was able to significantly improve ambulation with multiple short bouts of mobility in the room. The pt continues to require modA to complete sit-stand transfers and initial standing balance due to posterior lean and limited power in LE. Pt will continue to benefit from skilled PT acutely and post-acute rehab to progress functional strength, independence, and stability prior to return home.     If plan is discharge home, recommend the following: A lot of help with walking and/or transfers;A lot of help with bathing/dressing/bathroom;Assistance with cooking/housework;Assist for transportation;Help with stairs or ramp for entrance   Can travel by private vehicle     No  Equipment Recommendations  Wheelchair (measurements PT);Wheelchair cushion (measurements PT)    Recommendations for Other Services       Precautions / Restrictions Precautions Precautions: Fall Recall of Precautions/Restrictions: Impaired Precaution/Restrictions Comments: cues for precautions and log roll Required Braces or Orthoses: Spinal Brace Spinal Brace: Thoracolumbosacral orthotic;Applied in sitting position (only has to use with  ambulation per order) Restrictions Weight Bearing Restrictions Per Provider Order: No     Mobility  Bed Mobility Overal bed mobility: Needs Assistance Bed Mobility: Rolling, Sidelying to Sit Rolling: Min assist, Used rails Sidelying to sit: HOB elevated, Used rails       General bed mobility comments: minA to initiate roll, then modA to elevate trunk, cues throughout    Transfers Overall transfer level: Needs assistance Equipment used: Rolling walker (2 wheels) Transfers: Sit to/from Stand, Bed to chair/wheelchair/BSC Sit to Stand: Mod assist   Step pivot transfers: Mod assist       General transfer comment: modA to power up, max cues each rep for hand positioning. completed x6 in session. modA due to posterior lean in standing and with stepping    Ambulation/Gait Ambulation/Gait assistance: Min assist, +2 safety/equipment Gait Distance (Feet): 3 Feet (+ 10 ft + 53ft) Assistive device: Rolling walker (2 wheels) Gait Pattern/deviations: Step-through pattern, Decreased stride length, Shuffle, Trunk flexed Gait velocity: decreased Gait velocity interpretation: <1.31 ft/sec, indicative of household ambulator   General Gait Details: small steps with minimal clearance, improved trunk and head position with cues. significant kyphosis at baselein   Stairs             Wheelchair Mobility     Tilt Bed    Modified Rankin (Stroke Patients Only)       Balance Overall balance assessment: Needs assistance Sitting-balance support: No upper extremity supported, Feet supported, Bilateral upper extremity supported Sitting balance-Leahy Scale: Poor Sitting balance - Comments: Needs UE support and external support to sit on edge of stretcher for up to 10 min.  Called nurse to show her pt had rash on back. Postural control: Posterior lean Standing balance support: Bilateral upper extremity supported, During functional activity, Reliant  on assistive device for  balance Standing balance-Leahy Scale: Poor Standing balance comment: Reliant on RW and external support                            Communication Communication Communication: Impaired Factors Affecting Communication: Hearing impaired  Cognition Arousal: Alert Behavior During Therapy: WFL for tasks assessed/performed   PT - Cognitive impairments: No apparent impairments                       PT - Cognition Comments: repeated cues for technique, but WFL for questions, limited by [place] Following commands: Intact      Cueing Cueing Techniques: Verbal cues  Exercises      General Comments General comments (skin integrity, edema, etc.): VSS on RA, family present and supportive.      Pertinent Vitals/Pain Pain Assessment Pain Assessment: No/denies pain Pain Intervention(s): Monitored during session    Home Living                          Prior Function            PT Goals (current goals can now be found in the care plan section) Acute Rehab PT Goals Patient Stated Goal: to go home per pt but wife states pt needs Rehab PT Goal Formulation: With patient/family Time For Goal Achievement: 08/08/24 Potential to Achieve Goals: Good Progress towards PT goals: Progressing toward goals    Frequency    Min 2X/week       AM-PAC PT 6 Clicks Mobility   Outcome Measure  Help needed turning from your back to your side while in a flat bed without using bedrails?: A Little Help needed moving from lying on your back to sitting on the side of a flat bed without using bedrails?: A Lot Help needed moving to and from a bed to a chair (including a wheelchair)?: A Lot Help needed standing up from a chair using your arms (e.g., wheelchair or bedside chair)?: A Lot Help needed to walk in hospital room?: A Lot Help needed climbing 3-5 steps with a railing? : Total 6 Click Score: 12    End of Session Equipment Utilized During Treatment: Gait belt;Back  brace Activity Tolerance: Patient tolerated treatment well Patient left: with call bell/phone within reach;with family/visitor present;in chair;with chair alarm set Nurse Communication: Mobility status PT Visit Diagnosis: Unsteadiness on feet (R26.81);Muscle weakness (generalized) (M62.81)     Time: 8452-8379 PT Time Calculation (min) (ACUTE ONLY): 33 min  Charges:    $Gait Training: 8-22 mins $Therapeutic Exercise: 8-22 mins PT General Charges $$ ACUTE PT VISIT: 1 Visit                     [name], PT, DPT   Acute Rehabilitation Department Office [phone] Secure Chat Communication Preferred   Izetta JULIANNA Call 07/28/2024, 4:31 PM

## 2024-07-28 NOTE — Progress Notes (Signed)
 Progress Note   Patient: Harold Rogers FMW:[id number] DOB: 1935/09/16 DOA: 07/24/2024  DOS: the patient was seen and examined on 07/28/2024   Brief hospital course:  89M hx aortic stenosis, CKD, DM, htn/hld, GERD.  Recently admitted to [facility] 10/14-10/16/2025 for generalized weakness and chronic back pain from comp fx.  Returning from [facility] for AMS pain not well controlled.    Assessment and Plan:   Intractable back pain with history of compression fracture  - Recent compression fracture and T12, L2, L5.  Repeat imaging on presentation showing no changes and no new compression fractures.  Patient admits to pain and weakness that is uncontrolled.  Limiting his ability to ambulate.  Interestingly he had initially refused TLSO brace and pain medications.  Tried to encourage these modalities to increase his participation with PT.  Ordered lidocaine  patch, as needed oxycodone , Flexeril.  Encourage participation with PT/OT.  TLSO brace at bedside.    Reported abdominal pain - Reported abdominal pain on presentation however appears to be resolved at this time.  CT abdomen pelvis negative.  UA negative.  Appears resolved.   Diabetes mellitus - Insulin sliding scale on board.   HTN/HLD - Home medications resumed.  Constipation - Bowel regimen initiated.  MiraLAX daily, Dulcolax p.o./suppository as needed.  Fleets enema as needed.   Physical debilitation muscle weakness - Exacerbated by above.  Discussion with patient's son stating that they do not wish to return to previous facility.  PT/OT recommending STR.  Will work with [facility] and family on disposition planning.   Subjective: Patient resting comfortably.  A bit anxious this morning, states he feels he is getting a bit swollen.  Also very anxious that he has not had a bowel movement and what he describes as 2 weeks.  Admits he was fitted for his TLSO brace and is encouraged to work with PT.  Denies any fever, shortness of breath, chest  pain, nausea, vomiting, abdominal pain.  Physical Exam:  Vitals:   07/28/24 0019 07/28/24 0409 07/28/24 0814 07/28/24 1208  BP: 106/63 (!) 101/53 (!) 103/59 110/60  Pulse: 72 74    Resp: 16 16 17 16   Temp: 97.7 F (36.5 C) 97.6 F (36.4 C) 99.3 F (37.4 C) 97.8 F (36.6 C)  TempSrc: Oral Oral Oral Oral  SpO2: 97% 94%    Weight:      Height:        GENERAL:  Alert, pleasant, no acute distress, frail HEENT:  EOMI CARDIOVASCULAR:  RRR, no murmurs appreciated RESPIRATORY:  Clear to auscultation, no wheezing, rales, or rhonchi GASTROINTESTINAL:  Soft, nontender, nondistended EXTREMITIES:  No LE edema bilaterally NEURO:  No new focal deficits appreciated SKIN:  No rashes noted PSYCH:  Appropriate mood and affect, anxious   Data Reviewed:  Imaging Studies: ECHOCARDIOGRAM COMPLETE Result Date: 07/26/2024    ECHOCARDIOGRAM REPORT   Patient Name:   Harold Rogers Date of Exam: 07/26/2024 Medical Rec #:  [id number]     Height:       71.0 in Accession #:    [id number]    Weight:       169.8 lb Date of Birth:  08-30-1935     BSA:          1.967 m Patient Age:    89 years      BP:           102/54 mmHg Patient Gender: M             HR:  71 bpm. Exam Location:  Inpatient Procedure: 2D Echo (Both Spectral and Color Flow Doppler were utilized during            procedure). Indications:    elevated troponin  History:        Patient has no prior history of Echocardiogram examinations.                 Risk Factors:Dyslipidemia and Hypertension.  Sonographer:    Tinnie Barefoot RDCS Referring Phys: [id number] RATHORE  Sonographer Comments: Image acquisition challenging due to respiratory motion. IMPRESSIONS  1. Left ventricular ejection fraction, by estimation, is 60 to 65%. The left ventricle has normal function. The left ventricle has no regional wall motion abnormalities. There is mild left ventricular hypertrophy. Left ventricular diastolic parameters are consistent with Grade I  diastolic dysfunction (impaired relaxation).  2. Right ventricular systolic function is normal. The right ventricular size is normal. There is normal pulmonary artery systolic pressure.  3. The mitral valve is abnormal. Mild mitral valve regurgitation. No evidence of mitral stenosis.  4. Moderate aortic stenosis. AVA VTI 1.09, mean gradient 14, DI 0.38, SVI 26. Lower than expected gradient likely due to low SVI. The aortic valve has an indeterminant number of cusps. There is severe calcifcation of the aortic valve. There is severe thickening of the aortic valve. Aortic valve regurgitation is not visualized. Moderate aortic valve stenosis.  5. The inferior vena cava is normal in size with greater than 50% respiratory variability, suggesting right atrial pressure of 3 mmHg. FINDINGS  Left Ventricle: Left ventricular ejection fraction, by estimation, is 60 to 65%. The left ventricle has normal function. The left ventricle has no regional wall motion abnormalities. The left ventricular internal cavity size was normal in size. There is  mild left ventricular hypertrophy. Left ventricular diastolic parameters are consistent with Grade I diastolic dysfunction (impaired relaxation). Normal left ventricular filling pressure. Right Ventricle: The right ventricular size is normal. Right vetricular wall thickness was not well visualized. Right ventricular systolic function is normal. There is normal pulmonary artery systolic pressure. The tricuspid regurgitant velocity is 2.17 m/s, and with an assumed right atrial pressure of 8 mmHg, the estimated right ventricular systolic pressure is 26.8 mmHg. Left [facility]: Left atrial size was normal in size. Right [facility]: Right atrial size was normal in size. Pericardium: There is no evidence of pericardial effusion. Mitral Valve: The mitral valve is abnormal. Mild mitral valve regurgitation. No evidence of mitral valve stenosis. Tricuspid Valve: The tricuspid valve is normal in structure.  Tricuspid valve regurgitation is not demonstrated. No evidence of tricuspid stenosis. Aortic Valve: Moderate aortic stenosis. AVA VTI 1.09, mean gradient 14, DI 0.38, SVI 26. Lower than expected gradient likely due to low SVI. The aortic valve has an indeterminant number of cusps. There is severe calcifcation of the aortic valve. There is  severe thickening of the aortic valve. There is severe aortic valve annular calcification. Aortic valve regurgitation is not visualized. Moderate aortic stenosis is present. Aortic valve mean gradient measures 14.0 mmHg. Aortic valve peak gradient measures 30.9 mmHg. Aortic valve area, by VTI measures 1.09 cm. Pulmonic Valve: The pulmonic valve was not well visualized. Pulmonic valve regurgitation is not visualized. No evidence of pulmonic stenosis. Aorta: The aortic root and ascending aorta are structurally normal, with no evidence of dilitation. Venous: The inferior vena cava is normal in size with greater than 50% respiratory variability, suggesting right atrial pressure of 3 mmHg. IAS/Shunts: No atrial level shunt detected by  color flow Doppler.  LEFT VENTRICLE PLAX 2D LVIDd:         4.60 cm   Diastology LVIDs:         3.30 cm   LV e' medial:    4.24 cm/s LV PW:         1.10 cm   LV E/e' medial:  13.6 LV IVS:        1.10 cm   LV e' lateral:   6.96 cm/s LVOT diam:     1.90 cm   LV E/e' lateral: 8.3 LV SV:         52 LV SV Index:   26 LVOT Area:     2.84 cm  RIGHT VENTRICLE             IVC RV Basal diam:  2.90 cm     IVC diam: 2.30 cm RV S prime:     12.60 cm/s TAPSE (M-mode): 2.2 cm LEFT ATRIUM             Index        RIGHT ATRIUM           Index LA diam:        3.90 cm 1.98 cm/m   RA Area:     20.40 cm LA Vol (A2C):   66.4 ml 33.76 ml/m  RA Volume:   51.40 ml  26.14 ml/m LA Vol (A4C):   70.5 ml 35.85 ml/m LA Biplane Vol: 68.1 ml 34.63 ml/m  AORTIC VALVE AV Area (Vmax):    0.89 cm AV Area (Vmean):   0.95 cm AV Area (VTI):     1.09 cm AV Vmax:           278.00 cm/s  AV Vmean:          166.000 cm/s AV VTI:            0.475 m AV Peak Grad:      30.9 mmHg AV Mean Grad:      14.0 mmHg LVOT Vmax:         87.65 cm/s LVOT Vmean:        55.800 cm/s LVOT VTI:          0.182 m LVOT/AV VTI ratio: 0.38  AORTA Ao Root diam: 3.40 cm Ao Asc diam:  3.30 cm MITRAL VALVE               TRICUSPID VALVE MV Area (PHT): 2.29 cm    TR Peak grad:   18.8 mmHg MV Decel Time: 332 msec    TR Vmax:        217.00 cm/s MV E velocity: 57.50 cm/s MV A velocity: 94.00 cm/s  SHUNTS MV E/A ratio:  0.61        Systemic VTI:  0.18 m                            Systemic Diam: 1.90 cm Dorn Ross MD Electronically signed by Dorn Ross MD Signature Date/Time: 07/26/2024/11:42:45 AM    Final    CT ABDOMEN PELVIS W CONTRAST Result Date: 07/24/2024 CLINICAL DATA:  Fatigue, rhonchi, abdominal pain EXAM: CT ANGIOGRAPHY CHEST CT ABDOMEN AND PELVIS WITH CONTRAST TECHNIQUE: Multidetector CT imaging of the chest was performed using the standard protocol during bolus administration of intravenous contrast. Multiplanar CT image reconstructions and MIPs were obtained to evaluate the vascular anatomy. Multidetector CT imaging of the abdomen and pelvis was performed using  the standard protocol during bolus administration of intravenous contrast. RADIATION DOSE REDUCTION: This exam was performed according to the departmental dose-optimization program which includes automated exposure control, adjustment of the mA and/or kV according to patient size and/or use of iterative reconstruction technique. CONTRAST:  75mL OMNIPAQUE IOHEXOL 350 MG/ML SOLN COMPARISON:  07/24/2024 FINDINGS: CTA CHEST FINDINGS Cardiovascular: This is a technically adequate evaluation of the pulmonary vasculature. No filling defects or pulmonary emboli. The heart is unremarkable without pericardial effusion. No evidence of thoracic aortic aneurysm or dissection. Atherosclerosis of the aorta and coronary vasculature. Mediastinum/Nodes: No enlarged  mediastinal, hilar, or axillary lymph nodes. Thyroid  gland, trachea, and esophagus demonstrate no significant findings. Moderate hiatal hernia. Lungs/Pleura: There are small free-flowing bilateral pleural effusions, right greater than left. Compressive atelectasis within the dependent bilateral lower lobes. No acute airspace disease or pneumothorax. Central airways are patent. Upper lobe predominant emphysema. Musculoskeletal: Chronic T3, T12, and L2 compression deformities. No acute bony abnormalities. Reconstructed images demonstrate no additional findings. Review of the MIP images confirms the above findings. CT ABDOMEN and PELVIS FINDINGS Hepatobiliary: No focal liver abnormality is seen. Status post cholecystectomy. No biliary dilatation. Pancreas: Unremarkable. No pancreatic ductal dilatation or surrounding inflammatory changes. Spleen: Normal in size without focal abnormality. Adrenals/Urinary Tract: The adrenals are unremarkable. Simple appearing bilateral renal cortical and peripelvic cysts do not require specific imaging follow-up. No obstructive uropathy within either kidney. Bladder is unremarkable. Stomach/Bowel: No bowel obstruction or ileus. Moderate hiatal hernia. Sigmoid diverticulosis without evidence of acute diverticulitis. No bowel wall thickening or inflammatory change. Minimal retained stool throughout the colon. Vascular/Lymphatic: Diffuse aortic atherosclerosis. Multifocal high-grade stenoses within the bilateral common iliac and external iliac arteries. No pathologic adenopathy. Reproductive: The prostate is enlarged. Other: No free fluid or free intraperitoneal gas. No abdominal wall hernia. Musculoskeletal: No acute or destructive bony abnormalities. Chronic compression deformities of T12, L2, and L5. Reconstructed images demonstrate no additional findings. Review of the MIP images confirms the above findings. IMPRESSION: Chest: 1. No evidence of pulmonary embolus. 2. Small free-flowing  bilateral pleural effusions and dependent lower lobe atelectasis. 3. Hiatal hernia. 4. Aortic Atherosclerosis (ICD10-I70.0) and Emphysema (ICD10-J43.9). Abdomen/pelvis: 1. Sigmoid diverticulosis without diverticulitis. 2. Enlarged prostate. 3. Aortic Atherosclerosis (ICD10-I70.0). Multifocal high-grade stenoses of the bilateral common and external iliac arteries. Electronically Signed   By: Ozell Daring M.D.   On: 07/24/2024 23:34   CT Angio Chest PE W and/or Wo Contrast Result Date: 07/24/2024 CLINICAL DATA:  Fatigue, rhonchi, abdominal pain EXAM: CT ANGIOGRAPHY CHEST CT ABDOMEN AND PELVIS WITH CONTRAST TECHNIQUE: Multidetector CT imaging of the chest was performed using the standard protocol during bolus administration of intravenous contrast. Multiplanar CT image reconstructions and MIPs were obtained to evaluate the vascular anatomy. Multidetector CT imaging of the abdomen and pelvis was performed using the standard protocol during bolus administration of intravenous contrast. RADIATION DOSE REDUCTION: This exam was performed according to the departmental dose-optimization program which includes automated exposure control, adjustment of the mA and/or kV according to patient size and/or use of iterative reconstruction technique. CONTRAST:  75mL OMNIPAQUE IOHEXOL 350 MG/ML SOLN COMPARISON:  07/24/2024 FINDINGS: CTA CHEST FINDINGS Cardiovascular: This is a technically adequate evaluation of the pulmonary vasculature. No filling defects or pulmonary emboli. The heart is unremarkable without pericardial effusion. No evidence of thoracic aortic aneurysm or dissection. Atherosclerosis of the aorta and coronary vasculature. Mediastinum/Nodes: No enlarged mediastinal, hilar, or axillary lymph nodes. Thyroid  gland, trachea, and esophagus demonstrate no significant findings. Moderate hiatal hernia. Lungs/Pleura: There  are small free-flowing bilateral pleural effusions, right greater than left. Compressive atelectasis  within the dependent bilateral lower lobes. No acute airspace disease or pneumothorax. Central airways are patent. Upper lobe predominant emphysema. Musculoskeletal: Chronic T3, T12, and L2 compression deformities. No acute bony abnormalities. Reconstructed images demonstrate no additional findings. Review of the MIP images confirms the above findings. CT ABDOMEN and PELVIS FINDINGS Hepatobiliary: No focal liver abnormality is seen. Status post cholecystectomy. No biliary dilatation. Pancreas: Unremarkable. No pancreatic ductal dilatation or surrounding inflammatory changes. Spleen: Normal in size without focal abnormality. Adrenals/Urinary Tract: The adrenals are unremarkable. Simple appearing bilateral renal cortical and peripelvic cysts do not require specific imaging follow-up. No obstructive uropathy within either kidney. Bladder is unremarkable. Stomach/Bowel: No bowel obstruction or ileus. Moderate hiatal hernia. Sigmoid diverticulosis without evidence of acute diverticulitis. No bowel wall thickening or inflammatory change. Minimal retained stool throughout the colon. Vascular/Lymphatic: Diffuse aortic atherosclerosis. Multifocal high-grade stenoses within the bilateral common iliac and external iliac arteries. No pathologic adenopathy. Reproductive: The prostate is enlarged. Other: No free fluid or free intraperitoneal gas. No abdominal wall hernia. Musculoskeletal: No acute or destructive bony abnormalities. Chronic compression deformities of T12, L2, and L5. Reconstructed images demonstrate no additional findings. Review of the MIP images confirms the above findings. IMPRESSION: Chest: 1. No evidence of pulmonary embolus. 2. Small free-flowing bilateral pleural effusions and dependent lower lobe atelectasis. 3. Hiatal hernia. 4. Aortic Atherosclerosis (ICD10-I70.0) and Emphysema (ICD10-J43.9). Abdomen/pelvis: 1. Sigmoid diverticulosis without diverticulitis. 2. Enlarged prostate. 3. Aortic Atherosclerosis  (ICD10-I70.0). Multifocal high-grade stenoses of the bilateral common and external iliac arteries. Electronically Signed   By: Ozell Daring M.D.   On: 07/24/2024 23:34   DG Chest 2 View Result Date: 07/24/2024 CLINICAL DATA:  Fatigue, rhonchi EXAM: CHEST - 2 VIEW COMPARISON:  12/09/2021 FINDINGS: Frontal and lateral views of the chest are obtained on 3 images. The evaluation is limited due to patient positioning and cooperation. Cardiac silhouette remains enlarged. Hiatal hernia again noted. There are bibasilar veiling opacities, right greater than left, consistent with effusions and underlying consolidation. No pneumothorax. IMPRESSION: 1. Bilateral pleural effusions and basilar consolidation, right greater than left. 2. Hiatal hernia. Electronically Signed   By: Ozell Daring M.D.   On: 07/24/2024 20:40    There are no new results to review at this time.  Previous records (including but not limited to H&P, progress notes, nursing notes, TOC management) were reviewed in assessment of this patient.  Labs: CBC: Recent Labs  Lab 07/24/24 2028 07/25/24 0627 07/27/24 0401  WBC 7.4 7.3 7.7  NEUTROABS 6.0  --   --   HGB 11.6* 11.5* 11.0*  HCT 35.0* 35.7* 34.2*  MCV 94.3 95.5 93.4  PLT 291 312 376   Basic Metabolic Panel: Recent Labs  Lab 07/24/24 2028 07/27/24 0401  NA 135 132*  K 4.3 3.9  CL 104 102  CO2 22 22  GLUCOSE 156* 136*  BUN 22 21  CREATININE 0.89 1.00  CALCIUM 7.8* 7.6*  MG  --  1.9   Liver Function Tests: Recent Labs  Lab 07/24/24 2028  AST 41  ALT 32  ALKPHOS 55  BILITOT 0.7  PROT 4.8*  ALBUMIN 1.9*   CBG: Recent Labs  Lab 07/27/24 1131 07/27/24 1606 07/27/24 2128 07/28/24 0813 07/28/24 1211  GLUCAP 165* 174* 165* 127* 163*    Scheduled Meds:  aspirin EC  81 mg Oral Daily   enoxaparin (LOVENOX) injection  40 mg Subcutaneous Q24H   ezetimibe  10  mg Oral Daily   insulin aspart  0-5 Units Subcutaneous QHS   insulin aspart  0-9 Units  Subcutaneous TID WC   lidocaine   1 patch Transdermal Q24H   polyethylene glycol  17 g Oral Daily   rosuvastatin  40 mg Oral Daily   Continuous Infusions: PRN Meds:.acetaminophen **OR** acetaminophen, alum & mag hydroxide-simeth, bisacodyl, bisacodyl, cyclobenzaprine, oxyCODONE , sodium phosphate, traMADol  Family Communication: None at bedside  Disposition: Status is: Observation The patient remains OBS appropriate and will d/c before 2 midnights.     Time spent: 32 minutes  Length of inpatient stay: 0 days  Author: Carliss LELON Canales, DO 07/28/2024 2:20 PM  For on call review [email or web address].

## 2024-07-28 NOTE — TOC Progression Note (Addendum)
 Transition of Care [facility]) - Progression Note    Patient Details  Name: Harold Rogers MRN: [id number] Date of Birth: December 07, 1934  [facility] CM/SW Contact  Isaiah Public, LCSWA Phone Number: 07/28/2024, 10:16 AM  Clinical Narrative:     CSW heard back from [place] with [facility] who confirmed they can offer SNF bed for patient.Whitney informed CSW that it was be $48.00 a day that insurance will not cover. CSW informed patients son Todd. Don accepted SNF bed offer for patient. CSW requested for CMA Jon to start insurance authorization for patient. CSW will continue to follow.  Update- Jon CMA informed CSW that patients insurance authorization is pending.pending, [facility] representative [phone].                    Expected Discharge Plan and Services                                               Social Drivers of Health (SDOH) Interventions SDOH Screenings   Food Insecurity: No Food Insecurity (07/25/2024)  Housing: Unknown (07/25/2024)  Transportation Needs: No Transportation Needs (07/25/2024)  Utilities: Not At Risk (07/25/2024)  Social Connections: Moderately Integrated (07/25/2024)  Tobacco Use: Medium Risk (07/25/2024)    Readmission Risk Interventions     No data to display

## 2024-07-29 DIAGNOSIS — M549 Dorsalgia, unspecified: Secondary | ICD-10-CM | POA: Diagnosis not present

## 2024-07-29 DIAGNOSIS — R531 Weakness: Secondary | ICD-10-CM | POA: Diagnosis not present

## 2024-07-29 DIAGNOSIS — S32000S Wedge compression fracture of unspecified lumbar vertebra, sequela: Secondary | ICD-10-CM | POA: Diagnosis not present

## 2024-07-29 DIAGNOSIS — R7989 Other specified abnormal findings of blood chemistry: Secondary | ICD-10-CM | POA: Diagnosis not present

## 2024-07-29 LAB — BASIC METABOLIC PANEL WITH GFR
Anion gap: 8 (ref 5–15)
BUN: 15 mg/dL (ref 8–23)
CO2: 24 mmol/L (ref 22–32)
Calcium: 7.8 mg/dL — ABNORMAL LOW (ref 8.9–10.3)
Chloride: 102 mmol/L (ref 98–111)
Creatinine, Ser: 0.99 mg/dL (ref 0.61–1.24)
GFR, Estimated: >60 mL/min (ref >60)
Glucose, Bld: 110 mg/dL — ABNORMAL HIGH (ref 70–99)
Potassium: 3.9 mmol/L (ref 3.5–5.1)
Sodium: 134 mmol/L — ABNORMAL LOW (ref 135–145)

## 2024-07-29 LAB — CBC
HCT: 34.8 % — ABNORMAL LOW (ref 39.0–52.0)
Hemoglobin: 11.3 g/dL — ABNORMAL LOW (ref 13.0–17.0)
MCH: 30.7 pg (ref 26.0–34.0)
MCHC: 32.5 g/dL (ref 30.0–36.0)
MCV: 94.6 fL (ref 80.0–100.0)
Platelets: 384 K/uL (ref 150–400)
RBC: 3.68 MIL/uL — ABNORMAL LOW (ref 4.22–5.81)
RDW: 13.7 % (ref 11.5–15.5)
WBC: 9 K/uL (ref 4.0–10.5)
nRBC: 0 % (ref 0.0–0.2)

## 2024-07-29 LAB — MAGNESIUM: Magnesium: 1.9 mg/dL (ref 1.7–2.4)

## 2024-07-29 LAB — GLUCOSE, CAPILLARY
Glucose-Capillary: 128 mg/dL — ABNORMAL HIGH (ref 70–99)
Glucose-Capillary: 171 mg/dL — ABNORMAL HIGH (ref 70–99)

## 2024-07-29 MED ORDER — CYCLOBENZAPRINE HCL 5 MG PO TABS: 5.0000 mg | ORAL_TABLET | Freq: Three times a day (TID) | ORAL | Status: AC | PRN

## 2024-07-29 MED ORDER — OXYCODONE HCL 5 MG PO TABS: 5.0000 mg | ORAL_TABLET | Freq: Four times a day (QID) | ORAL | 0 refills | Status: AC | PRN

## 2024-07-29 MED ORDER — POLYETHYLENE GLYCOL 3350 17 G PO PACK: 17.0000 g | PACK | Freq: Every day | ORAL | Status: AC | PRN

## 2024-07-29 MED ORDER — INSULIN ASPART 100 UNIT/ML IJ SOLN: 0.0000 [IU] | Freq: Three times a day (TID) | INTRAMUSCULAR | Status: AC

## 2024-07-29 MED ORDER — BISACODYL 5 MG PO TBEC: 5.0000 mg | DELAYED_RELEASE_TABLET | Freq: Every day | ORAL | Status: AC | PRN

## 2024-07-29 MED ORDER — LIDOCAINE 5 % EX PTCH: 1.0000 | MEDICATED_PATCH | CUTANEOUS | Status: AC

## 2024-07-29 NOTE — Progress Notes (Signed)
 Physical Therapy Treatment Patient Details Name: Harold Rogers MRN: [id number] DOB: 1934-11-09 Today's Date: 07/29/2024   History of Present Illness Harold Rogers is a 89 y.o. male admitted 10/17 with abdominal pain and elevated troponin and decline at SNF.   Has compression fxs and TLSO was ordered. PMH: aortic stenosis, CKD, type 2 diabetes, hypertension, hyperlipidemia, GERD.  Recently admitted to [facility] 10/14-10/16/2025 for generalized weakness and chronic back pain in the setting of history of known compression fracture.  Patient was found to have dehydration and bacteriuria and sent to SNF10/16.    PT Comments  The pt was agreeable to session, and making good progress with ambulation endurance and independence with transfers. Pt consistently needing less physical assist for sit-stand transfers, but does benefit from mod cues for hand placement and assist to power up to standing. Pt eager to progress and hopeful to d/c to post-acute rehab soon. Recommendations remain appropriate, will continue efforts acutely.     If plan is discharge home, recommend the following: A lot of help with walking and/or transfers;A lot of help with bathing/dressing/bathroom;Assistance with cooking/housework;Assist for transportation;Help with stairs or ramp for entrance   Can travel by private vehicle     Yes  Equipment Recommendations  None recommended by PT    Recommendations for Other Services       Precautions / Restrictions Precautions Precautions: Fall Recall of Precautions/Restrictions: Impaired Precaution/Restrictions Comments: cues for precautions and log roll Required Braces or Orthoses: Spinal Brace Spinal Brace: Thoracolumbosacral orthotic;Applied in sitting position (only has to use with ambulation per order) Restrictions Weight Bearing Restrictions Per Provider Order: No     Mobility  Bed Mobility Overal bed mobility: Needs Assistance             General bed mobility  comments: pt OOB at start and end of session    Transfers Overall transfer level: Needs assistance Equipment used: Rolling walker (2 wheels) Transfers: Sit to/from Stand, Bed to chair/wheelchair/BSC Sit to Stand: Min assist   Step pivot transfers: Min assist       General transfer comment: min-modA to power up, minA with cues for hand placement. increased time    Ambulation/Gait Ambulation/Gait assistance: Min assist, +2 safety/equipment Gait Distance (Feet): 100 Feet (+ 100 ft) Assistive device: Rolling walker (2 wheels) Gait Pattern/deviations: Step-through pattern, Decreased stride length, Shuffle, Trunk flexed Gait velocity: decreased Gait velocity interpretation: <1.31 ft/sec, indicative of household ambulator   General Gait Details: small steps with minimal clearance, improved trunk and head position with cues. significant kyphosis at baseline      Balance Overall balance assessment: Needs assistance Sitting-balance support: No upper extremity supported, Feet supported, Bilateral upper extremity supported Sitting balance-Leahy Scale: Poor Sitting balance - Comments: Needs UE support and external support to sit on edge of stretcher for up to 10 min.  Called nurse to show her pt had rash on back. Postural control: Posterior lean Standing balance support: Bilateral upper extremity supported, During functional activity, Reliant on assistive device for balance Standing balance-Leahy Scale: Poor Standing balance comment: Reliant on RW and external support                            Communication Communication Communication: Impaired Factors Affecting Communication: Hearing impaired  Cognition Arousal: Alert Behavior During Therapy: WFL for tasks assessed/performed   PT - Cognitive impairments: No apparent impairments  PT - Cognition Comments: repeated cues for technique, but WFL for questions, limited by [facility] Following  commands: Intact      Cueing Cueing Techniques: Verbal cues  Exercises      General Comments General comments (skin integrity, edema, etc.): VSS on RA, HR 115bpm with gait      Pertinent Vitals/Pain Pain Assessment Pain Assessment: Faces Faces Pain Scale: Hurts a little bit Pain Location: back with poorly controlled return to sitting Pain Descriptors / Indicators: Discomfort Pain Intervention(s): Monitored during session, Limited activity within patient's tolerance     PT Goals (current goals can now be found in the care plan section) Acute Rehab PT Goals Patient Stated Goal: to go home per pt but wife states pt needs Rehab PT Goal Formulation: With patient/family Time For Goal Achievement: 08/08/24 Potential to Achieve Goals: Good Progress towards PT goals: Progressing toward goals    Frequency    Min 2X/week       AM-PAC PT 6 Clicks Mobility   Outcome Measure  Help needed turning from your back to your side while in a flat bed without using bedrails?: A Little Help needed moving from lying on your back to sitting on the side of a flat bed without using bedrails?: A Lot Help needed moving to and from a bed to a chair (including a wheelchair)?: A Little Help needed standing up from a chair using your arms (e.g., wheelchair or bedside chair)?: A Lot Help needed to walk in hospital room?: A Little Help needed climbing 3-5 steps with a railing? : A Lot 6 Click Score: 15    End of Session Equipment Utilized During Treatment: Gait belt;Back brace Activity Tolerance: Patient tolerated treatment well Patient left: with call bell/phone within reach;in chair;with chair alarm set;with nursing/sitter in room Nurse Communication: Mobility status PT Visit Diagnosis: Unsteadiness on feet (R26.81);Muscle weakness (generalized) (M62.81)     Time: 9140-9075 PT Time Calculation (min) (ACUTE ONLY): 25 min  Charges:    $Gait Training: 8-22 mins $Therapeutic Exercise: 8-22  mins PT General Charges $$ ACUTE PT VISIT: 1 Visit                     [name], PT, DPT   Acute Rehabilitation Department Office [phone] Secure Chat Communication Preferred   Izetta JULIANNA Call 07/29/2024, 9:32 AM

## 2024-07-29 NOTE — Progress Notes (Signed)
 Occupational Therapy Treatment Patient Details Name: Harold Rogers MRN: [id number] DOB: [birth year] Today's Date: 07/29/2024   History of present illness Harold Rogers is a 89 y.o. male admitted 10/17 with abdominal pain and elevated troponin and decline at SNF.   Has compression fxs and TLSO was ordered. PMH: aortic stenosis, CKD, type 2 diabetes, hypertension, hyperlipidemia, GERD.  Recently admitted to [facility] 10/14-10/16/2025 for generalized weakness and chronic back pain in the setting of history of known compression fracture.  Patient was found to have dehydration and bacteriuria and sent to SNF10/16.   OT comments  Pt progressing well towards goals. Progressed to complete short step pivot to [facility] with mod assist for STS. Once in standing, pt able to take steps at [facility] with support of RW. Pt continues to be limited by decreased strength and balance. Continue to recommend <3 hours of skilled rehab daily to optimize independence levels. Will continue to follow acutely.      If plan is discharge home, recommend the following:  A lot of help with walking and/or transfers;A lot of help with bathing/dressing/bathroom;Assist for transportation   Equipment Recommendations  Other (comment) (Defer to next venue)       Precautions / Restrictions Precautions Precautions: Fall Recall of Precautions/Restrictions: Impaired Precaution/Restrictions Comments: cues for precautions and log roll Required Braces or Orthoses: Spinal Brace Spinal Brace: Thoracolumbosacral orthotic;Applied in sitting position Restrictions Weight Bearing Restrictions Per Provider Order: No       Mobility Bed Mobility Overal bed mobility: Needs Assistance Bed Mobility: Supine to Sit     Supine to sit: Supervision, Used rails, HOB elevated     General bed mobility comments: Pt initating since HOB elevated >40 degrees, cues to avoid twisting d/t back precautions    Transfers Overall transfer level: Needs  assistance Equipment used: Rolling walker (2 wheels) Transfers: Sit to/from Stand, Bed to chair/wheelchair/BSC Sit to Stand: Min assist     Step pivot transfers: Contact guard assist           Balance Overall balance assessment: Needs assistance Sitting-balance support: No upper extremity supported, Feet supported, Bilateral upper extremity supported Sitting balance-Leahy Scale: Fair   Postural control: Posterior lean Standing balance support: Bilateral upper extremity supported, During functional activity, Reliant on assistive device for balance Standing balance-Leahy Scale: Poor Standing balance comment: Reliant on RW and external support         ADL either performed or assessed with clinical judgement   ADL Overall ADL's : Needs assistance/impaired     Toilet Transfer: Moderate assistance;Ambulation;BSC/3in1 Toilet Transfer Details (indicate cue type and reason): Mod assist to stand Toileting- Clothing Manipulation and Hygiene: Minimal assistance;Sit to/from stand Toileting - Clothing Manipulation Details (indicate cue type and reason): Assist for balance     Functional mobility during ADLs: Moderate assistance;Rolling walker (2 wheels) General ADL Comments: Can stand briefly with unilateral support    Extremity/Trunk Assessment Upper Extremity Assessment Upper Extremity Assessment: Generalized weakness   Lower Extremity Assessment Lower Extremity Assessment: Defer to PT evaluation        Vision   Vision Assessment?: No apparent visual deficits         Communication Communication Communication: Impaired Factors Affecting Communication: Hearing impaired   Cognition Arousal: Alert Behavior During Therapy: WFL for tasks assessed/performed Cognition: No apparent impairments       OT - Cognition Comments: Poor STM, improved cog from inital eval   Following commands: Intact        Cueing   Cueing Techniques: Verbal  cues        General Comments VSS  on RA, left pt with supervising PT    Pertinent Vitals/ Pain       Pain Assessment Pain Assessment: No/denies pain   Frequency  Min 2X/week        Progress Toward Goals  OT Goals(current goals can now be found in the care plan section)  Progress towards OT goals: Progressing toward goals  Acute Rehab OT Goals Patient Stated Goal: To get better OT Goal Formulation: With patient Time For Goal Achievement: 08/09/24 Potential to Achieve Goals: Good ADL Goals Pt Will Perform Lower Body Dressing: with mod assist;sit to/from stand Pt Will Transfer to Toilet: with mod assist;bedside commode;stand pivot transfer Pt Will Perform Toileting - Clothing Manipulation and hygiene: with mod assist;sit to/from stand;sitting/lateral leans  Plan         AM-PAC OT 6 Clicks Daily Activity     Outcome Measure   Help from another person eating meals?: None Help from another person taking care of personal grooming?: A Little Help from another person toileting, which includes using toliet, bedpan, or urinal?: A Little Help from another person bathing (including washing, rinsing, drying)?: A Lot Help from another person to put on and taking off regular upper body clothing?: A Little Help from another person to put on and taking off regular lower body clothing?: A Lot 6 Click Score: 17    End of Session Equipment Utilized During Treatment: Gait belt;Rolling walker (2 wheels)  OT Visit Diagnosis: Unsteadiness on feet (R26.81);Other abnormalities of gait and mobility (R26.89);Muscle weakness (generalized) (M62.81)   Activity Tolerance Patient tolerated treatment well   Patient Left Other (comment) (in standing with supervising PT)   Nurse Communication Mobility status        Time: 9171-9098 OT Time Calculation (min): 33 min  Charges: OT General Charges $OT Visit: 1 Visit OT Treatments $Self Care/Home Management : 23-37 mins  Adrianne BROCKS, OT  Acute Rehabilitation Services Office  [phone] Secure chat preferred   Adrianne GORMAN Savers 07/29/2024, 9:26 AM

## 2024-07-29 NOTE — Discharge Summary (Signed)
 Physician Discharge Summary   Patient: Harold Rogers MRN: [id number] DOB: May 24, 1935  Admit date:     07/24/2024  Discharge date: 07/29/24  Discharge Physician: Sabas GORMAN Brod   PCP: Ofilia Lamar CROME, MD   Recommendations at discharge:   Patient to be discharged to skilled nursing facility  Discharge Diagnoses: Principal Problem:   Intractable back pain Active Problems:   Elevated troponin   Abdominal pain   Normocytic anemia   Pleural effusion   Generalized weakness   Lumbar compression fracture (HCC)  Resolved Problems:   * No resolved hospital problems. [facility] Course:  17M hx aortic stenosis, CKD, DM, htn/hld, GERD. Recently admitted to [facility] 10/14-10/16/2025 for generalized weakness and chronic back pain from comp fx. Returning from [facility] for AMS pain not well controlled.   Assessment and Plan:  Intractable back pain with history of compression fracture  - Recent compression fracture and T12, L2, L5.  Repeat imaging on presentation showing no changes and no new compression fractures.  Patient admits to pain and weakness that is uncontrolled.  Limiting his ability to ambulate.  Interestingly he had initially refused TLSO brace and pain medications.  Improved with lidocaine  patch, as needed oxycodone , Flexeril.  Encourage participation with PT/OT.  TLSO brace   Reported abdominal pain - Reported abdominal pain on presentation however appears to be resolved at this time.  CT abdomen pelvis negative.  UA negative.  Resolved at this time   Diabetes mellitus - NovoLog sensitive sliding scale   HTN/HLD - Home medications resumed.   Constipation - Bowel regimen initiated.  MiraLAX daily, Dulcolax p.o./suppository as needed.     Physical debilitation muscle weakness - Exacerbated by above.  Discussion with patient's son stating that they do not wish to return to previous facility.  PT/OT recommending STR.  Will work with [facility] and family on disposition planning.            Consultants:  Procedures performed:  Disposition: Skilled nursing facility Diet recommendation:  Discharge Diet Orders (From admission, onward)     Start     Ordered   07/29/24 0000  Diet - low sodium heart healthy        07/29/24 1254           Regular diet DISCHARGE MEDICATION: Allergies as of 07/29/2024   No Known Allergies      Medication List     STOP taking these medications    Calcium Carb-Cholecalciferol 500-5 MG-MCG Tabs   Calcium Carb-Cholecalciferol 600-20 MG-MCG Chew   famotidine-calcium carbonate-magnesium hydroxide 10-800-165 MG chewable tablet Commonly known as: PEPCID COMPLETE   meloxicam 7.5 MG tablet Commonly known as: MOBIC       TAKE these medications    aspirin EC 81 MG tablet Take 81 mg by mouth daily.   bisacodyl 5 MG EC tablet Commonly known as: DULCOLAX Take 1 tablet (5 mg total) by mouth daily as needed for moderate constipation.   Calcium Citrate-Vitamin D 315-6.25 MG-MCG Tabs Take 1 tablet by mouth in the morning.   cyanocobalamin 1000 MCG tablet Commonly known as: VITAMIN B12 Take 1,000 mcg by mouth daily.   cyclobenzaprine 5 MG tablet Commonly known as: FLEXERIL Take 1 tablet (5 mg total) by mouth 3 (three) times daily as needed for muscle spasms.   ezetimibe 10 MG tablet Commonly known as: ZETIA Take 10 mg by mouth daily.   insulin aspart 100 UNIT/ML injection Commonly known as: novoLOG Inject 0-9 Units into the skin 3 (  three) times daily with meals. Sliding scale insulin Less than 70 initiate hypoglycemia protocol 70-120  0 units 120-150 1 unit 151-200 2 units 201-250 3 units 251-300 5 units 301-350 7 units 351-400 9 units Greater than 400 call MD   lidocaine  5 % Commonly known as: LIDODERM  Place 1 patch onto the skin daily. Remove & Discard patch within 12 hours or as directed by MD   oxyCODONE  5 MG immediate release tablet Commonly known as: Oxy IR/ROXICODONE  Take 1 tablet (5 mg total) by mouth  every 6 (six) hours as needed for severe pain (pain score 7-10).   polyethylene glycol 17 g packet Commonly known as: MIRALAX / GLYCOLAX Take 17 g by mouth daily as needed.   rosuvastatin 40 MG tablet Commonly known as: CRESTOR Take 40 mg by mouth daily.        Discharge Exam: Filed Weights   07/25/24 2130  Weight: 77 kg   General-appears in no acute distress Heart-S1-S2, regular, no murmur auscultated Lungs-clear to auscultation bilaterally, no wheezing or crackles auscultated Abdomen-soft, nontender, no organomegaly Extremities-no edema in the lower extremities Neuro-alert, oriented x3, no focal deficit noted  Condition at discharge: good  The results of significant diagnostics from this hospitalization (including imaging, microbiology, ancillary and laboratory) are listed below for reference.   Imaging Studies: ECHOCARDIOGRAM COMPLETE Result Date: 07/26/2024    ECHOCARDIOGRAM REPORT   Patient Name:   Harold Rogers Date of Exam: 07/26/2024 Medical Rec #:  [id number]     Height:       71.0 in Accession #:    [id number]    Weight:       169.8 lb Date of Birth:  03-Jul-1935     BSA:          1.967 m Patient Age:    89 years      BP:           102/54 mmHg Patient Gender: M             HR:           71 bpm. Exam Location:  Inpatient Procedure: 2D Echo (Both Spectral and Color Flow Doppler were utilized during            procedure). Indications:    elevated troponin  History:        Patient has no prior history of Echocardiogram examinations.                 Risk Factors:Dyslipidemia and Hypertension.  Sonographer:    Tinnie Barefoot RDCS Referring Phys: [id number] RATHORE  Sonographer Comments: Image acquisition challenging due to respiratory motion. IMPRESSIONS  1. Left ventricular ejection fraction, by estimation, is 60 to 65%. The left ventricle has normal function. The left ventricle has no regional wall motion abnormalities. There is mild left ventricular hypertrophy. Left  ventricular diastolic parameters are consistent with Grade I diastolic dysfunction (impaired relaxation).  2. Right ventricular systolic function is normal. The right ventricular size is normal. There is normal pulmonary artery systolic pressure.  3. The mitral valve is abnormal. Mild mitral valve regurgitation. No evidence of mitral stenosis.  4. Moderate aortic stenosis. AVA VTI 1.09, mean gradient 14, DI 0.38, SVI 26. Lower than expected gradient likely due to low SVI. The aortic valve has an indeterminant number of cusps. There is severe calcifcation of the aortic valve. There is severe thickening of the aortic valve. Aortic valve regurgitation is not visualized. Moderate aortic valve stenosis.  5. The inferior  vena cava is normal in size with greater than 50% respiratory variability, suggesting right atrial pressure of 3 mmHg. FINDINGS  Left Ventricle: Left ventricular ejection fraction, by estimation, is 60 to 65%. The left ventricle has normal function. The left ventricle has no regional wall motion abnormalities. The left ventricular internal cavity size was normal in size. There is  mild left ventricular hypertrophy. Left ventricular diastolic parameters are consistent with Grade I diastolic dysfunction (impaired relaxation). Normal left ventricular filling pressure. Right Ventricle: The right ventricular size is normal. Right vetricular wall thickness was not well visualized. Right ventricular systolic function is normal. There is normal pulmonary artery systolic pressure. The tricuspid regurgitant velocity is 2.17 m/s, and with an assumed right atrial pressure of 8 mmHg, the estimated right ventricular systolic pressure is 26.8 mmHg. Left [facility]: Left atrial size was normal in size. Right [facility]: Right atrial size was normal in size. Pericardium: There is no evidence of pericardial effusion. Mitral Valve: The mitral valve is abnormal. Mild mitral valve regurgitation. No evidence of mitral valve stenosis.  Tricuspid Valve: The tricuspid valve is normal in structure. Tricuspid valve regurgitation is not demonstrated. No evidence of tricuspid stenosis. Aortic Valve: Moderate aortic stenosis. AVA VTI 1.09, mean gradient 14, DI 0.38, SVI 26. Lower than expected gradient likely due to low SVI. The aortic valve has an indeterminant number of cusps. There is severe calcifcation of the aortic valve. There is  severe thickening of the aortic valve. There is severe aortic valve annular calcification. Aortic valve regurgitation is not visualized. Moderate aortic stenosis is present. Aortic valve mean gradient measures 14.0 mmHg. Aortic valve peak gradient measures 30.9 mmHg. Aortic valve area, by VTI measures 1.09 cm. Pulmonic Valve: The pulmonic valve was not well visualized. Pulmonic valve regurgitation is not visualized. No evidence of pulmonic stenosis. Aorta: The aortic root and ascending aorta are structurally normal, with no evidence of dilitation. Venous: The inferior vena cava is normal in size with greater than 50% respiratory variability, suggesting right atrial pressure of 3 mmHg. IAS/Shunts: No atrial level shunt detected by color flow Doppler.  LEFT VENTRICLE PLAX 2D LVIDd:         4.60 cm   Diastology LVIDs:         3.30 cm   LV e' medial:    4.24 cm/s LV PW:         1.10 cm   LV E/e' medial:  13.6 LV IVS:        1.10 cm   LV e' lateral:   6.96 cm/s LVOT diam:     1.90 cm   LV E/e' lateral: 8.3 LV SV:         52 LV SV Index:   26 LVOT Area:     2.84 cm  RIGHT VENTRICLE             IVC RV Basal diam:  2.90 cm     IVC diam: 2.30 cm RV S prime:     12.60 cm/s TAPSE (M-mode): 2.2 cm LEFT ATRIUM             Index        RIGHT ATRIUM           Index LA diam:        3.90 cm 1.98 cm/m   RA Area:     20.40 cm LA Vol (A2C):   66.4 ml 33.76 ml/m  RA Volume:   51.40 ml  26.14 ml/m LA Vol (A4C):  70.5 ml 35.85 ml/m LA Biplane Vol: 68.1 ml 34.63 ml/m  AORTIC VALVE AV Area (Vmax):    0.89 cm AV Area (Vmean):   0.95 cm  AV Area (VTI):     1.09 cm AV Vmax:           278.00 cm/s AV Vmean:          166.000 cm/s AV VTI:            0.475 m AV Peak Grad:      30.9 mmHg AV Mean Grad:      14.0 mmHg LVOT Vmax:         87.65 cm/s LVOT Vmean:        55.800 cm/s LVOT VTI:          0.182 m LVOT/AV VTI ratio: 0.38  AORTA Ao Root diam: 3.40 cm Ao Asc diam:  3.30 cm MITRAL VALVE               TRICUSPID VALVE MV Area (PHT): 2.29 cm    TR Peak grad:   18.8 mmHg MV Decel Time: 332 msec    TR Vmax:        217.00 cm/s MV E velocity: 57.50 cm/s MV A velocity: 94.00 cm/s  SHUNTS MV E/A ratio:  0.61        Systemic VTI:  0.18 m                            Systemic Diam: 1.90 cm Dorn Ross MD Electronically signed by Dorn Ross MD Signature Date/Time: 07/26/2024/11:42:45 AM    Final    CT ABDOMEN PELVIS W CONTRAST Result Date: 07/24/2024 CLINICAL DATA:  Fatigue, rhonchi, abdominal pain EXAM: CT ANGIOGRAPHY CHEST CT ABDOMEN AND PELVIS WITH CONTRAST TECHNIQUE: Multidetector CT imaging of the chest was performed using the standard protocol during bolus administration of intravenous contrast. Multiplanar CT image reconstructions and MIPs were obtained to evaluate the vascular anatomy. Multidetector CT imaging of the abdomen and pelvis was performed using the standard protocol during bolus administration of intravenous contrast. RADIATION DOSE REDUCTION: This exam was performed according to the departmental dose-optimization program which includes automated exposure control, adjustment of the mA and/or kV according to patient size and/or use of iterative reconstruction technique. CONTRAST:  75mL OMNIPAQUE IOHEXOL 350 MG/ML SOLN COMPARISON:  07/24/2024 FINDINGS: CTA CHEST FINDINGS Cardiovascular: This is a technically adequate evaluation of the pulmonary vasculature. No filling defects or pulmonary emboli. The heart is unremarkable without pericardial effusion. No evidence of thoracic aortic aneurysm or dissection. Atherosclerosis of the aorta  and coronary vasculature. Mediastinum/Nodes: No enlarged mediastinal, hilar, or axillary lymph nodes. Thyroid  gland, trachea, and esophagus demonstrate no significant findings. Moderate hiatal hernia. Lungs/Pleura: There are small free-flowing bilateral pleural effusions, right greater than left. Compressive atelectasis within the dependent bilateral lower lobes. No acute airspace disease or pneumothorax. Central airways are patent. Upper lobe predominant emphysema. Musculoskeletal: Chronic T3, T12, and L2 compression deformities. No acute bony abnormalities. Reconstructed images demonstrate no additional findings. Review of the MIP images confirms the above findings. CT ABDOMEN and PELVIS FINDINGS Hepatobiliary: No focal liver abnormality is seen. Status post cholecystectomy. No biliary dilatation. Pancreas: Unremarkable. No pancreatic ductal dilatation or surrounding inflammatory changes. Spleen: Normal in size without focal abnormality. Adrenals/Urinary Tract: The adrenals are unremarkable. Simple appearing bilateral renal cortical and peripelvic cysts do not require specific imaging follow-up. No obstructive uropathy within either kidney. Bladder is unremarkable. Stomach/Bowel: No  bowel obstruction or ileus. Moderate hiatal hernia. Sigmoid diverticulosis without evidence of acute diverticulitis. No bowel wall thickening or inflammatory change. Minimal retained stool throughout the colon. Vascular/Lymphatic: Diffuse aortic atherosclerosis. Multifocal high-grade stenoses within the bilateral common iliac and external iliac arteries. No pathologic adenopathy. Reproductive: The prostate is enlarged. Other: No free fluid or free intraperitoneal gas. No abdominal wall hernia. Musculoskeletal: No acute or destructive bony abnormalities. Chronic compression deformities of T12, L2, and L5. Reconstructed images demonstrate no additional findings. Review of the MIP images confirms the above findings. IMPRESSION: Chest: 1.  No evidence of pulmonary embolus. 2. Small free-flowing bilateral pleural effusions and dependent lower lobe atelectasis. 3. Hiatal hernia. 4. Aortic Atherosclerosis (ICD10-I70.0) and Emphysema (ICD10-J43.9). Abdomen/pelvis: 1. Sigmoid diverticulosis without diverticulitis. 2. Enlarged prostate. 3. Aortic Atherosclerosis (ICD10-I70.0). Multifocal high-grade stenoses of the bilateral common and external iliac arteries. Electronically Signed   By: Ozell Daring M.D.   On: 07/24/2024 23:34   CT Angio Chest PE W and/or Wo Contrast Result Date: 07/24/2024 CLINICAL DATA:  Fatigue, rhonchi, abdominal pain EXAM: CT ANGIOGRAPHY CHEST CT ABDOMEN AND PELVIS WITH CONTRAST TECHNIQUE: Multidetector CT imaging of the chest was performed using the standard protocol during bolus administration of intravenous contrast. Multiplanar CT image reconstructions and MIPs were obtained to evaluate the vascular anatomy. Multidetector CT imaging of the abdomen and pelvis was performed using the standard protocol during bolus administration of intravenous contrast. RADIATION DOSE REDUCTION: This exam was performed according to the departmental dose-optimization program which includes automated exposure control, adjustment of the mA and/or kV according to patient size and/or use of iterative reconstruction technique. CONTRAST:  75mL OMNIPAQUE IOHEXOL 350 MG/ML SOLN COMPARISON:  07/24/2024 FINDINGS: CTA CHEST FINDINGS Cardiovascular: This is a technically adequate evaluation of the pulmonary vasculature. No filling defects or pulmonary emboli. The heart is unremarkable without pericardial effusion. No evidence of thoracic aortic aneurysm or dissection. Atherosclerosis of the aorta and coronary vasculature. Mediastinum/Nodes: No enlarged mediastinal, hilar, or axillary lymph nodes. Thyroid  gland, trachea, and esophagus demonstrate no significant findings. Moderate hiatal hernia. Lungs/Pleura: There are small free-flowing bilateral pleural  effusions, right greater than left. Compressive atelectasis within the dependent bilateral lower lobes. No acute airspace disease or pneumothorax. Central airways are patent. Upper lobe predominant emphysema. Musculoskeletal: Chronic T3, T12, and L2 compression deformities. No acute bony abnormalities. Reconstructed images demonstrate no additional findings. Review of the MIP images confirms the above findings. CT ABDOMEN and PELVIS FINDINGS Hepatobiliary: No focal liver abnormality is seen. Status post cholecystectomy. No biliary dilatation. Pancreas: Unremarkable. No pancreatic ductal dilatation or surrounding inflammatory changes. Spleen: Normal in size without focal abnormality. Adrenals/Urinary Tract: The adrenals are unremarkable. Simple appearing bilateral renal cortical and peripelvic cysts do not require specific imaging follow-up. No obstructive uropathy within either kidney. Bladder is unremarkable. Stomach/Bowel: No bowel obstruction or ileus. Moderate hiatal hernia. Sigmoid diverticulosis without evidence of acute diverticulitis. No bowel wall thickening or inflammatory change. Minimal retained stool throughout the colon. Vascular/Lymphatic: Diffuse aortic atherosclerosis. Multifocal high-grade stenoses within the bilateral common iliac and external iliac arteries. No pathologic adenopathy. Reproductive: The prostate is enlarged. Other: No free fluid or free intraperitoneal gas. No abdominal wall hernia. Musculoskeletal: No acute or destructive bony abnormalities. Chronic compression deformities of T12, L2, and L5. Reconstructed images demonstrate no additional findings. Review of the MIP images confirms the above findings. IMPRESSION: Chest: 1. No evidence of pulmonary embolus. 2. Small free-flowing bilateral pleural effusions and dependent lower lobe atelectasis. 3. Hiatal hernia. 4. Aortic Atherosclerosis (ICD10-I70.0) and  Emphysema (ICD10-J43.9). Abdomen/pelvis: 1. Sigmoid diverticulosis without  diverticulitis. 2. Enlarged prostate. 3. Aortic Atherosclerosis (ICD10-I70.0). Multifocal high-grade stenoses of the bilateral common and external iliac arteries. Electronically Signed   By: Ozell Daring M.D.   On: 07/24/2024 23:34   DG Chest 2 View Result Date: 07/24/2024 CLINICAL DATA:  Fatigue, rhonchi EXAM: CHEST - 2 VIEW COMPARISON:  12/09/2021 FINDINGS: Frontal and lateral views of the chest are obtained on 3 images. The evaluation is limited due to patient positioning and cooperation. Cardiac silhouette remains enlarged. Hiatal hernia again noted. There are bibasilar veiling opacities, right greater than left, consistent with effusions and underlying consolidation. No pneumothorax. IMPRESSION: 1. Bilateral pleural effusions and basilar consolidation, right greater than left. 2. Hiatal hernia. Electronically Signed   By: Ozell Daring M.D.   On: 07/24/2024 20:40    Microbiology: Results for orders placed or performed during the hospital encounter of 07/24/24  Urine Culture (for pregnant, neutropenic or urologic patients or patients with an indwelling urinary catheter)     Status: None   Collection Time: 07/25/24  6:27 AM   Specimen: Urine, Clean Catch  Result Value Ref Range Status   Specimen Description URINE, CLEAN CATCH  Final   Special Requests NONE  Final   Culture   Final    NO GROWTH Performed at [facility], [address] [place], [place] [address]    Report Status 07/26/2024 FINAL  Final    Labs: CBC: Recent Labs  Lab 07/24/24 2028 07/25/24 0627 07/27/24 0401 07/29/24 0429  WBC 7.4 7.3 7.7 9.0  NEUTROABS 6.0  --   --   --   HGB 11.6* 11.5* 11.0* 11.3*  HCT 35.0* 35.7* 34.2* 34.8*  MCV 94.3 95.5 93.4 94.6  PLT 291 312 376 384   Basic Metabolic Panel: Recent Labs  Lab 07/24/24 2028 07/27/24 0401 07/29/24 0429  NA 135 132* 134*  K 4.3 3.9 3.9  CL 104 102 102  CO2 22 22 24   GLUCOSE 156* 136* 110*  BUN 22 21 15   CREATININE 0.89 1.00 0.99   CALCIUM 7.8* 7.6* 7.8*  MG  --  1.9 1.9   Liver Function Tests: Recent Labs  Lab 07/24/24 2028  AST 41  ALT 32  ALKPHOS 55  BILITOT 0.7  PROT 4.8*  ALBUMIN 1.9*   CBG: Recent Labs  Lab 07/28/24 1211 07/28/24 1618 07/28/24 2131 07/29/24 0747 07/29/24 1131  GLUCAP 163* 133* 225* 128* 171*    Discharge time spent: greater than 30 minutes.  Signed: Sabas GORMAN Brod, MD [facility] 07/29/2024

## 2024-07-29 NOTE — Plan of Care (Signed)

## 2024-07-29 NOTE — TOC Progression Note (Signed)
 Transition of Care [facility]) - Progression Note    Patient Details  Name: Harold Rogers MRN: [id number] Date of Birth: 12-Oct-1934  Transition of Care [facility] CM/SW Contact  Isaiah Public, LCSWA Phone Number: 07/29/2024, 10:12 AM  Clinical Narrative:      Jon CMA informed CSW that patients insurance was approved for SNF 1022 - 89/72 [employer] Cert#[id number] .Whitney with [facility] confirmed patient can dc over to facility today if medically ready. CSW informed MD.                   Expected Discharge Plan and Services                                               Social Drivers of Health (SDOH) Interventions SDOH Screenings   Food Insecurity: No Food Insecurity (07/25/2024)  Housing: Unknown (07/25/2024)  Transportation Needs: No Transportation Needs (07/25/2024)  Utilities: Not At Risk (07/25/2024)  Social Connections: Moderately Integrated (07/25/2024)  Tobacco Use: Medium Risk (07/25/2024)    Readmission Risk Interventions     No data to display

## 2024-07-29 NOTE — TOC Transition Note (Signed)
 Transition of Care [facility]) - Discharge Note   Patient Details  Name: Harold Rogers MRN: [id number] Date of Birth: Feb 16, 1935  [facility]) CM/SW Contact:  Isaiah Public, LCSWA Phone Number: 07/29/2024, 1:18 PM   Clinical Narrative:      Patient will DC to: [facility]   Anticipated DC date: 07/29/2024  Family notified: Todd  Transport by: [facility]  ?  Per MD patient ready for DC to [facility] . RN, patient, patient's family, and facility notified of DC. Discharge Summary sent to facility. RN given number for report [phone] RM# [phone]. DC packet on chart. DNR signed by MD attached to patients DC packet.Ambulance transport requested for patient.  CSW signing off.        Patient Goals and CMS Choice            Discharge Placement                       Discharge Plan and Services Additional resources added to the After Visit Summary for                                       Social Drivers of Health (SDOH) Interventions SDOH Screenings   Food Insecurity: No Food Insecurity (07/25/2024)  Housing: Unknown (07/25/2024)  Transportation Needs: No Transportation Needs (07/25/2024)  Utilities: Not At Risk (07/25/2024)  Social Connections: Moderately Integrated (07/25/2024)  Tobacco Use: Medium Risk (07/25/2024)     Readmission Risk Interventions     No data to display
